# Patient Record
Sex: Male | Born: 1955
Health system: Southern US, Community
[De-identification: ages and names within clinical notes are randomized; demographics above are authoritative.]

## PROBLEM LIST (undated history)

## (undated) DIAGNOSIS — Z789 Other specified health status: Secondary | ICD-10-CM

## (undated) DIAGNOSIS — E7800 Pure hypercholesterolemia, unspecified: Secondary | ICD-10-CM

## (undated) DIAGNOSIS — N210 Calculus in bladder: Secondary | ICD-10-CM

## (undated) DIAGNOSIS — N390 Urinary tract infection, site not specified: Secondary | ICD-10-CM

## (undated) DIAGNOSIS — M109 Gout, unspecified: Secondary | ICD-10-CM

## (undated) DIAGNOSIS — I739 Peripheral vascular disease, unspecified: Secondary | ICD-10-CM

## (undated) DIAGNOSIS — K219 Gastro-esophageal reflux disease without esophagitis: Secondary | ICD-10-CM

## (undated) HISTORY — DX: Calculus in bladder: N21.0

## (undated) HISTORY — DX: Urinary tract infection, site not specified: N39.0

## (undated) HISTORY — PX: ROTATOR CUFF REPAIR: SHX139

## (undated) HISTORY — DX: Peripheral vascular disease, unspecified: I73.9

## (undated) HISTORY — PX: BLADDER SURGERY: SHX569

## (undated) HISTORY — PX: LACERATION REPAIR: SHX5168

---

## 2015-09-18 DIAGNOSIS — Z6828 Body mass index (BMI) 28.0-28.9, adult: Secondary | ICD-10-CM | POA: Diagnosis not present

## 2015-09-18 DIAGNOSIS — N390 Urinary tract infection, site not specified: Secondary | ICD-10-CM | POA: Diagnosis not present

## 2015-10-23 DIAGNOSIS — M1000 Idiopathic gout, unspecified site: Secondary | ICD-10-CM | POA: Diagnosis not present

## 2015-10-23 DIAGNOSIS — Z6827 Body mass index (BMI) 27.0-27.9, adult: Secondary | ICD-10-CM | POA: Diagnosis not present

## 2015-10-24 DIAGNOSIS — M10071 Idiopathic gout, right ankle and foot: Secondary | ICD-10-CM | POA: Diagnosis not present

## 2015-12-24 DIAGNOSIS — Z6828 Body mass index (BMI) 28.0-28.9, adult: Secondary | ICD-10-CM | POA: Diagnosis not present

## 2015-12-24 DIAGNOSIS — R300 Dysuria: Secondary | ICD-10-CM | POA: Diagnosis not present

## 2016-07-19 DIAGNOSIS — Z0001 Encounter for general adult medical examination with abnormal findings: Secondary | ICD-10-CM | POA: Diagnosis not present

## 2016-07-19 DIAGNOSIS — D751 Secondary polycythemia: Secondary | ICD-10-CM | POA: Diagnosis not present

## 2016-07-23 DIAGNOSIS — Z6826 Body mass index (BMI) 26.0-26.9, adult: Secondary | ICD-10-CM | POA: Diagnosis not present

## 2016-07-23 DIAGNOSIS — Z0001 Encounter for general adult medical examination with abnormal findings: Secondary | ICD-10-CM | POA: Diagnosis not present

## 2016-07-23 DIAGNOSIS — F17210 Nicotine dependence, cigarettes, uncomplicated: Secondary | ICD-10-CM | POA: Diagnosis not present

## 2017-01-13 DIAGNOSIS — F17210 Nicotine dependence, cigarettes, uncomplicated: Secondary | ICD-10-CM | POA: Diagnosis not present

## 2017-01-13 DIAGNOSIS — I10 Essential (primary) hypertension: Secondary | ICD-10-CM | POA: Diagnosis not present

## 2017-01-13 DIAGNOSIS — E782 Mixed hyperlipidemia: Secondary | ICD-10-CM | POA: Diagnosis not present

## 2017-01-13 DIAGNOSIS — Z9189 Other specified personal risk factors, not elsewhere classified: Secondary | ICD-10-CM | POA: Diagnosis not present

## 2017-01-19 DIAGNOSIS — Z0001 Encounter for general adult medical examination with abnormal findings: Secondary | ICD-10-CM | POA: Diagnosis not present

## 2017-01-19 DIAGNOSIS — Z6826 Body mass index (BMI) 26.0-26.9, adult: Secondary | ICD-10-CM | POA: Diagnosis not present

## 2017-01-19 DIAGNOSIS — Z1212 Encounter for screening for malignant neoplasm of rectum: Secondary | ICD-10-CM | POA: Diagnosis not present

## 2017-06-09 DIAGNOSIS — Z6826 Body mass index (BMI) 26.0-26.9, adult: Secondary | ICD-10-CM | POA: Diagnosis not present

## 2017-06-09 DIAGNOSIS — R1013 Epigastric pain: Secondary | ICD-10-CM | POA: Diagnosis not present

## 2017-06-09 DIAGNOSIS — R110 Nausea: Secondary | ICD-10-CM | POA: Diagnosis not present

## 2017-06-18 DIAGNOSIS — R1310 Dysphagia, unspecified: Secondary | ICD-10-CM | POA: Diagnosis not present

## 2017-06-18 DIAGNOSIS — Z6825 Body mass index (BMI) 25.0-25.9, adult: Secondary | ICD-10-CM | POA: Diagnosis not present

## 2017-06-18 DIAGNOSIS — J029 Acute pharyngitis, unspecified: Secondary | ICD-10-CM | POA: Diagnosis not present

## 2017-06-18 DIAGNOSIS — B9681 Helicobacter pylori [H. pylori] as the cause of diseases classified elsewhere: Secondary | ICD-10-CM | POA: Diagnosis not present

## 2017-06-22 DIAGNOSIS — Z6825 Body mass index (BMI) 25.0-25.9, adult: Secondary | ICD-10-CM | POA: Diagnosis not present

## 2017-06-22 DIAGNOSIS — B370 Candidal stomatitis: Secondary | ICD-10-CM | POA: Diagnosis not present

## 2017-07-14 DIAGNOSIS — I10 Essential (primary) hypertension: Secondary | ICD-10-CM | POA: Diagnosis not present

## 2017-07-14 DIAGNOSIS — M1000 Idiopathic gout, unspecified site: Secondary | ICD-10-CM | POA: Diagnosis not present

## 2017-07-14 DIAGNOSIS — D751 Secondary polycythemia: Secondary | ICD-10-CM | POA: Diagnosis not present

## 2017-07-14 DIAGNOSIS — F17210 Nicotine dependence, cigarettes, uncomplicated: Secondary | ICD-10-CM | POA: Diagnosis not present

## 2017-07-14 DIAGNOSIS — E782 Mixed hyperlipidemia: Secondary | ICD-10-CM | POA: Diagnosis not present

## 2017-07-20 DIAGNOSIS — F17210 Nicotine dependence, cigarettes, uncomplicated: Secondary | ICD-10-CM | POA: Diagnosis not present

## 2017-07-20 DIAGNOSIS — Z0001 Encounter for general adult medical examination with abnormal findings: Secondary | ICD-10-CM | POA: Diagnosis not present

## 2017-07-20 DIAGNOSIS — Z6825 Body mass index (BMI) 25.0-25.9, adult: Secondary | ICD-10-CM | POA: Diagnosis not present

## 2018-07-20 DIAGNOSIS — Z0001 Encounter for general adult medical examination with abnormal findings: Secondary | ICD-10-CM | POA: Diagnosis not present

## 2018-07-25 DIAGNOSIS — Z0001 Encounter for general adult medical examination with abnormal findings: Secondary | ICD-10-CM | POA: Diagnosis not present

## 2018-07-25 DIAGNOSIS — I10 Essential (primary) hypertension: Secondary | ICD-10-CM | POA: Diagnosis not present

## 2018-07-25 DIAGNOSIS — E782 Mixed hyperlipidemia: Secondary | ICD-10-CM | POA: Diagnosis not present

## 2018-07-25 DIAGNOSIS — R0602 Shortness of breath: Secondary | ICD-10-CM | POA: Diagnosis not present

## 2018-07-25 DIAGNOSIS — F5221 Male erectile disorder: Secondary | ICD-10-CM | POA: Diagnosis not present

## 2018-07-25 DIAGNOSIS — F17210 Nicotine dependence, cigarettes, uncomplicated: Secondary | ICD-10-CM | POA: Diagnosis not present

## 2018-07-25 DIAGNOSIS — I739 Peripheral vascular disease, unspecified: Secondary | ICD-10-CM | POA: Diagnosis not present

## 2018-07-25 DIAGNOSIS — Z1212 Encounter for screening for malignant neoplasm of rectum: Secondary | ICD-10-CM | POA: Diagnosis not present

## 2018-11-04 DIAGNOSIS — R140 Abdominal distension (gaseous): Secondary | ICD-10-CM | POA: Diagnosis not present

## 2018-11-04 DIAGNOSIS — K2970 Gastritis, unspecified, without bleeding: Secondary | ICD-10-CM | POA: Diagnosis not present

## 2018-11-04 DIAGNOSIS — D751 Secondary polycythemia: Secondary | ICD-10-CM | POA: Diagnosis not present

## 2018-11-04 DIAGNOSIS — K2900 Acute gastritis without bleeding: Secondary | ICD-10-CM | POA: Diagnosis not present

## 2018-11-04 DIAGNOSIS — Z6826 Body mass index (BMI) 26.0-26.9, adult: Secondary | ICD-10-CM | POA: Diagnosis not present

## 2018-11-04 DIAGNOSIS — R1013 Epigastric pain: Secondary | ICD-10-CM | POA: Diagnosis not present

## 2018-11-04 DIAGNOSIS — Z720 Tobacco use: Secondary | ICD-10-CM | POA: Diagnosis not present

## 2018-11-04 DIAGNOSIS — B9681 Helicobacter pylori [H. pylori] as the cause of diseases classified elsewhere: Secondary | ICD-10-CM | POA: Diagnosis not present

## 2018-11-04 DIAGNOSIS — F17200 Nicotine dependence, unspecified, uncomplicated: Secondary | ICD-10-CM | POA: Diagnosis not present

## 2018-11-04 DIAGNOSIS — R110 Nausea: Secondary | ICD-10-CM | POA: Diagnosis not present

## 2018-11-16 DIAGNOSIS — B370 Candidal stomatitis: Secondary | ICD-10-CM | POA: Diagnosis not present

## 2018-11-16 DIAGNOSIS — D751 Secondary polycythemia: Secondary | ICD-10-CM | POA: Diagnosis not present

## 2019-02-11 DIAGNOSIS — M109 Gout, unspecified: Secondary | ICD-10-CM | POA: Diagnosis not present

## 2019-02-11 DIAGNOSIS — K279 Peptic ulcer, site unspecified, unspecified as acute or chronic, without hemorrhage or perforation: Secondary | ICD-10-CM | POA: Diagnosis not present

## 2019-02-11 DIAGNOSIS — D751 Secondary polycythemia: Secondary | ICD-10-CM | POA: Diagnosis not present

## 2019-02-11 DIAGNOSIS — Z880 Allergy status to penicillin: Secondary | ICD-10-CM | POA: Diagnosis not present

## 2019-02-11 DIAGNOSIS — F17200 Nicotine dependence, unspecified, uncomplicated: Secondary | ICD-10-CM | POA: Diagnosis not present

## 2019-02-11 DIAGNOSIS — K2900 Acute gastritis without bleeding: Secondary | ICD-10-CM | POA: Diagnosis not present

## 2019-02-11 DIAGNOSIS — R109 Unspecified abdominal pain: Secondary | ICD-10-CM | POA: Diagnosis not present

## 2019-02-17 DIAGNOSIS — R109 Unspecified abdominal pain: Secondary | ICD-10-CM | POA: Diagnosis not present

## 2019-02-17 DIAGNOSIS — K146 Glossodynia: Secondary | ICD-10-CM | POA: Diagnosis not present

## 2019-02-17 DIAGNOSIS — F17210 Nicotine dependence, cigarettes, uncomplicated: Secondary | ICD-10-CM | POA: Diagnosis not present

## 2019-02-17 DIAGNOSIS — Z6826 Body mass index (BMI) 26.0-26.9, adult: Secondary | ICD-10-CM | POA: Diagnosis not present

## 2019-02-19 DIAGNOSIS — K279 Peptic ulcer, site unspecified, unspecified as acute or chronic, without hemorrhage or perforation: Secondary | ICD-10-CM | POA: Diagnosis not present

## 2019-03-06 DIAGNOSIS — E782 Mixed hyperlipidemia: Secondary | ICD-10-CM | POA: Diagnosis not present

## 2019-03-06 DIAGNOSIS — I739 Peripheral vascular disease, unspecified: Secondary | ICD-10-CM | POA: Diagnosis not present

## 2019-03-06 DIAGNOSIS — F17210 Nicotine dependence, cigarettes, uncomplicated: Secondary | ICD-10-CM | POA: Diagnosis not present

## 2019-03-06 DIAGNOSIS — I10 Essential (primary) hypertension: Secondary | ICD-10-CM | POA: Diagnosis not present

## 2019-03-06 DIAGNOSIS — Z23 Encounter for immunization: Secondary | ICD-10-CM | POA: Diagnosis not present

## 2019-03-19 ENCOUNTER — Ambulatory Visit (INDEPENDENT_AMBULATORY_CARE_PROVIDER_SITE_OTHER): Payer: BC Managed Care – PPO | Admitting: Otolaryngology

## 2019-03-19 DIAGNOSIS — K143 Hypertrophy of tongue papillae: Secondary | ICD-10-CM

## 2019-06-05 DIAGNOSIS — Z6827 Body mass index (BMI) 27.0-27.9, adult: Secondary | ICD-10-CM | POA: Diagnosis not present

## 2019-06-05 DIAGNOSIS — N390 Urinary tract infection, site not specified: Secondary | ICD-10-CM | POA: Diagnosis not present

## 2019-06-07 DIAGNOSIS — N390 Urinary tract infection, site not specified: Secondary | ICD-10-CM | POA: Diagnosis not present

## 2019-06-12 DIAGNOSIS — K6289 Other specified diseases of anus and rectum: Secondary | ICD-10-CM | POA: Diagnosis not present

## 2019-06-12 DIAGNOSIS — Z6826 Body mass index (BMI) 26.0-26.9, adult: Secondary | ICD-10-CM | POA: Diagnosis not present

## 2019-06-29 DIAGNOSIS — N390 Urinary tract infection, site not specified: Secondary | ICD-10-CM | POA: Diagnosis not present

## 2019-06-29 DIAGNOSIS — R300 Dysuria: Secondary | ICD-10-CM | POA: Diagnosis not present

## 2019-07-26 DIAGNOSIS — Z0001 Encounter for general adult medical examination with abnormal findings: Secondary | ICD-10-CM | POA: Diagnosis not present

## 2019-08-01 DIAGNOSIS — R0602 Shortness of breath: Secondary | ICD-10-CM | POA: Diagnosis not present

## 2019-08-01 DIAGNOSIS — E782 Mixed hyperlipidemia: Secondary | ICD-10-CM | POA: Diagnosis not present

## 2019-08-01 DIAGNOSIS — I10 Essential (primary) hypertension: Secondary | ICD-10-CM | POA: Diagnosis not present

## 2019-08-01 DIAGNOSIS — Z0001 Encounter for general adult medical examination with abnormal findings: Secondary | ICD-10-CM | POA: Diagnosis not present

## 2019-08-01 DIAGNOSIS — I739 Peripheral vascular disease, unspecified: Secondary | ICD-10-CM | POA: Diagnosis not present

## 2019-08-01 DIAGNOSIS — F17210 Nicotine dependence, cigarettes, uncomplicated: Secondary | ICD-10-CM | POA: Diagnosis not present

## 2019-08-01 DIAGNOSIS — Z1212 Encounter for screening for malignant neoplasm of rectum: Secondary | ICD-10-CM | POA: Diagnosis not present

## 2019-08-01 DIAGNOSIS — R300 Dysuria: Secondary | ICD-10-CM | POA: Diagnosis not present

## 2019-08-09 DIAGNOSIS — Z23 Encounter for immunization: Secondary | ICD-10-CM | POA: Diagnosis not present

## 2019-09-01 DIAGNOSIS — Z23 Encounter for immunization: Secondary | ICD-10-CM | POA: Diagnosis not present

## 2019-09-03 DIAGNOSIS — N3090 Cystitis, unspecified without hematuria: Secondary | ICD-10-CM | POA: Diagnosis not present

## 2019-09-03 DIAGNOSIS — Z6827 Body mass index (BMI) 27.0-27.9, adult: Secondary | ICD-10-CM | POA: Diagnosis not present

## 2019-10-08 DIAGNOSIS — N3090 Cystitis, unspecified without hematuria: Secondary | ICD-10-CM | POA: Diagnosis not present

## 2019-10-08 DIAGNOSIS — R300 Dysuria: Secondary | ICD-10-CM | POA: Diagnosis not present

## 2019-10-08 DIAGNOSIS — Z6826 Body mass index (BMI) 26.0-26.9, adult: Secondary | ICD-10-CM | POA: Diagnosis not present

## 2019-10-29 ENCOUNTER — Ambulatory Visit: Payer: BC Managed Care – PPO | Admitting: Urology

## 2019-11-14 ENCOUNTER — Ambulatory Visit (INDEPENDENT_AMBULATORY_CARE_PROVIDER_SITE_OTHER): Payer: BC Managed Care – PPO | Admitting: Urology

## 2019-11-14 ENCOUNTER — Encounter: Payer: Self-pay | Admitting: Urology

## 2019-11-14 ENCOUNTER — Other Ambulatory Visit (HOSPITAL_COMMUNITY)
Admission: RE | Admit: 2019-11-14 | Discharge: 2019-11-14 | Disposition: A | Discharge status: Home / Self Care | Payer: BC Managed Care – PPO | Source: Skilled Nursing Facility | Attending: Urology | Admitting: Urology

## 2019-11-14 ENCOUNTER — Other Ambulatory Visit: Payer: Self-pay

## 2019-11-14 VITALS — BP 184/93 | HR 80 | Temp 98.4°F | Ht 66.0 in | Wt 160.0 lb

## 2019-11-14 DIAGNOSIS — N390 Urinary tract infection, site not specified: Secondary | ICD-10-CM | POA: Insufficient documentation

## 2019-11-14 DIAGNOSIS — R300 Dysuria: Secondary | ICD-10-CM | POA: Diagnosis not present

## 2019-11-14 LAB — POCT URINALYSIS DIPSTICK
Bilirubin, UA: NEGATIVE
Glucose, UA: NEGATIVE
Ketones, UA: NEGATIVE
Nitrite, UA: NEGATIVE
Protein, UA: NEGATIVE
Spec Grav, UA: 1.02 (ref 1.010–1.025)
Urobilinogen, UA: 0.2 E.U./dL
pH, UA: 6 (ref 5.0–8.0)

## 2019-11-14 LAB — BLADDER SCAN AMB NON-IMAGING: Scan Result: 26.1

## 2019-11-14 NOTE — Progress Notes (Signed)
Urological Symptom Review  Patient is experiencing the following symptoms: Urinary tract infection Bladder stones Review of Systems   Gastrointestinal (upper)  : Negative for upper GI symptoms  Gastrointestinal (lower) : Negative for lower GI symptoms  Constitutional : Negative for symptoms  Skin: Negative for skin symptoms  Eyes: Negative for eye symptoms  Ear/Nose/Throat : Negative for Ear/Nose/Throat symptoms  Hematologic/Lymphatic: Negative for Hematologic/Lymphatic symptoms  Cardiovascular : Negative for cardiovascular symptoms  Respiratory : Negative for respiratory symptoms  Endocrine: Negative for endocrine symptoms  Musculoskeletal: None  Neurological: Negative for neurological symptoms  Psychologic: Negative for psychiatric symptoms

## 2019-11-14 NOTE — Progress Notes (Signed)
11/14/2019 4:09 PM   Ronnie Larsen [birth year] [id number]  Referring provider: Practice, [facility] [address] [place],  [place] [address]  Recurrent UTI and dysuria  HPI: [name] is a 64yo here for evaluation of recurrent UTI and dysuria. Over the past 10 years he has episodes of UTIs that occur in clusters. He was previously seen by Dr. Nechama Guard and was found to have multiple bladder calculi. He underwent cystolithalopaxy. He was doing well until the last 2-3 months when he was treated twice in a 3 week period for a UTI. Currently he has urinary urgency but no frequency, dysuria, nocturia, or gross hematuria.    PMH: Past Medical History:  Diagnosis Date  . Bladder stones   . Urinary tract infection     Surgical History: Past Surgical History:  Procedure Laterality Date  . BLADDER SURGERY      Home Medications:  Allergies as of 11/14/2019      Reactions   Penicillins       Medication List       Accurate as of November 14, 2019  4:09 PM. If you have any questions, ask your nurse or doctor.        omeprazole 20 MG capsule Commonly known as: PRILOSEC Take 20 mg by mouth daily.       Allergies:  Allergies  Allergen Reactions  . Penicillins     Family History: History reviewed. No pertinent family history.  Social History:  reports that he has been smoking. He has a 45.00 pack-year smoking history. He has never used smokeless tobacco. No history on file for alcohol use and drug use.  ROS: All other review of systems were reviewed and are negative except what is noted above in HPI  Physical Exam: BP (!) 184/93   Pulse 80   Temp 98.4 F (36.9 C)   Ht 5\' 6"  (1.676 m)   Wt 160 lb (72.6 kg)   BMI 25.82 kg/m   Constitutional:  Alert and oriented, No acute distress. HEENT: [place] AT, moist mucus membranes.  Trachea midline, no masses. Cardiovascular: No clubbing, cyanosis, or edema. Respiratory: Normal respiratory effort, no increased work of  breathing. GI: Abdomen is soft, nontender, nondistended, no abdominal masses GU: No CVA tenderness. Circumcised phallus. No masses/lesions on penis, testis, scrotum. Prostate 40g smooth no nodules no induration.  Lymph: No cervical or inguinal lymphadenopathy. Skin: No rashes, bruises or suspicious lesions. Neurologic: Grossly intact, no focal deficits, moving all 4 extremities. Psychiatric: Normal mood and affect.  Laboratory Data: No results found for: WBC, HGB, HCT, MCV, PLT  No results found for: CREATININE  No results found for: PSA  No results found for: TESTOSTERONE  No results found for: HGBA1C  Urinalysis    Component Value Date/Time   BILIRUBINUR neg 11/14/2019 1545   PROTEINUR Negative 11/14/2019 1545   UROBILINOGEN 0.2 11/14/2019 1545   NITRITE neg 11/14/2019 1545   LEUKOCYTESUR Trace (A) 11/14/2019 1545    No results found for: LABMICR, WBCUA, RBCUA, LABEPIT, MUCUS, BACTERIA  Pertinent Imaging:  No results found for this or any previous visit.  No results found for this or any previous visit.  No results found for this or any previous visit.  No results found for this or any previous visit.  No results found for this or any previous visit.  No results found for this or any previous visit.  No results found for this or any previous visit.  No results found for  this or any previous visit.   Assessment & Plan:    1. Recurrent UTI -CT stone study - POCT urinalysis dipstick - BLADDER SCAN AMB NON-IMAGING  2. Dysuria -likely related to his UTIs. We will await the CT stone study and the patient will need cystoscopy if the stone study is negative for calculi   No follow-ups on file.  Wilkie Aye, MD  [facility] [place]

## 2019-11-14 NOTE — Patient Instructions (Signed)
Bladder Stone  A bladder stone is a buildup of crystals made from the proteins and minerals found in urine. These substances build up when urine becomes too concentrated. Urine is concentrated when there is less water and more proteins and minerals in it. Bladder stones usually develop when a person has another medical condition that prevents the bladder from emptying completely. Crystals can form in the small amount of urine that is left in the bladder. Bladder stones that grow large can become painful and may block the flow of urine. What are the causes? This condition may be caused by:  An enlarged prostate, which prevents the bladder from emptying well.  An infection of a part of your urinary system (urinary tract infection, or UTI). This includes the: ? Kidneys. ? Bladder. ? Ureters. These are the tubes that carry urine to your bladder. ? Urethra. This is the tube that drains urine from your bladder.  A weak spot in the bladder that creates a small pouch (bladder diverticulum).  Nerve damage that may interfere with the signals from your brain to your bladder muscles (neurogenic bladder). This can result from conditions such as Parkinson's disease or spinal cord injuries. What increases the risk? This condition is more likely to develop in people who:  Get frequent UTIs.  Have another medical condition that affects the bladder.  Have a history of bladder surgery.  Have a spinal cord injury.  Have an abnormal shape of the bladder (deformity). What are the signs or symptoms? Common symptoms of this condition include:  Pain in the abdomen.  A need to urinate more often.  Difficulty or pain when urinating.  Blood in the urine.  Cloudy urine or urine that is dark in color.  Pain in the penis or testicles in men. Small bladder stones do not always cause symptoms. How is this diagnosed? This condition may be diagnosed based on your symptoms, medical history, and physical  exam. The physical exam will check for tenderness in your abdomen. For men, an exam in the rectum may be done to check the prostate gland.  You may have tests, such as: ? A urine test (urinalysis). ? A urine sample test to check for other infections (culture). ? Blood tests, including tests to look for a certain substance (creatinine). A creatinine level that is higher than normal could indicate a blockage. ? A procedure to check your bladder using a scope with a camera (cystoscopy).  You may also have imaging studies, such as: ? CT scan or ultrasound of your abdomen and the area between your hip bones (pelvis or pelvic area). ? An X-ray of your urinary system. How is this treated? This condition may be treated with:  Cystolitholapaxy. This procedure uses a laser, ultrasound, or other device to break the stone into smaller pieces. Fluids are used to flush the small pieces from the area.  Surgery to remove the stone.  A stent. This is a small mesh tube that is threaded into your ureter to make urine flow.  Medicines to treat pain. Follow these instructions at home: Medicines  Take over-the-counter and prescription medicines only as told by your health care provider.  Ask your health care provider if the medicine prescribed to you: ? Requires you to avoid driving or using heavy machinery. ? Can cause constipation. You may need to take these actions to prevent or treat constipation:  Take over-the-counter or prescription medicines.  Eat foods that are high in fiber, such as beans, whole   grains, and fresh fruits and vegetables.  Limit foods that are high in fat and processed sugars, such as fried or sweet foods. Alcohol use  Do not drink alcohol if: ? Your health care provider tells you not to drink. ? You are pregnant, may be pregnant, or are planning to become pregnant.  If you drink alcohol: ? Limit how much you drink to:  0-1 drink a day for women.  0-2 drinks a day for  men. ? Be aware of how much alcohol is in your drink. In the [place] one drink equals one 12 oz bottle of beer (355 mL), one 5 oz glass of wine (148 mL), or one 1 oz glass of hard liquor (44 mL). Activity  Rest as told by your health care provider.  Return to your normal activities as told by your health care provider. Ask your health care provider what activities are safe for you. General instructions   Drink enough fluid to keep your urine pale yellow.  Tell your health care provider about any unusual symptoms related to urinating. Early diagnosis of an enlarged prostate and other bladder conditions may reduce your risk of getting bladder stones.  Do not use any products that contain nicotine or tobacco, such as cigarettes, e-cigarettes, or chewing tobacco. If you need help quitting, ask your health care provider.  Do not use drugs. Where to find more information [facility] ([facility]): www.urologyhealth.org Contact a health care provider if you:  Have a fever.  Feel nauseous or vomit.  Are unable to urinate.  Have a large amount of blood in your urine. Get help right away if you:  Have severe back pain or pain in the lower part of your abdomen.  Cannot eat or drink without vomiting.  Vomit after taking your medicine. Summary  A bladder stone is a buildup of crystals made from the proteins and minerals found in urine. These substances build up when urine becomes too concentrated.  Bladder stones that grow large can become painful and may block the flow of urine.  Bladder stones may be treated with a laser, a stent, surgery, or pain medicines. This information is not intended to replace advice given to you by your health care provider. Make sure you discuss any questions you have with your health care provider. Document Revised: 11/23/2018 Document Reviewed: 11/23/2018 Elsevier Patient Education  2020 [employer]vier Inc.  

## 2019-11-16 LAB — URINE CULTURE: Culture: <10000 — AB

## 2019-11-22 ENCOUNTER — Telehealth: Payer: Self-pay

## 2019-11-22 NOTE — Telephone Encounter (Signed)
-----   Message from Malen Gauze, MD sent at 11/21/2019  1:34 PM EDT ----- negative ----- Message ----- From: Ferdinand Lango, RN Sent: 11/20/2019  10:13 AM EDT To: Malen Gauze, MD  culture

## 2019-11-22 NOTE — Telephone Encounter (Signed)
Called pt. No answer. No way to leave message . Letter mailed.

## 2019-12-06 ENCOUNTER — Ambulatory Visit (HOSPITAL_COMMUNITY)
Admission: RE | Admit: 2019-12-06 | Discharge: 2019-12-06 | Disposition: A | Discharge status: Home / Self Care | Payer: BC Managed Care – PPO | Source: Ambulatory Visit | Attending: Urology | Admitting: Urology

## 2019-12-06 ENCOUNTER — Other Ambulatory Visit: Payer: Self-pay

## 2019-12-06 DIAGNOSIS — I700 Atherosclerosis of aorta: Secondary | ICD-10-CM | POA: Diagnosis not present

## 2019-12-06 DIAGNOSIS — K760 Fatty (change of) liver, not elsewhere classified: Secondary | ICD-10-CM | POA: Diagnosis not present

## 2019-12-06 DIAGNOSIS — K828 Other specified diseases of gallbladder: Secondary | ICD-10-CM | POA: Diagnosis not present

## 2019-12-06 DIAGNOSIS — N390 Urinary tract infection, site not specified: Secondary | ICD-10-CM | POA: Insufficient documentation

## 2019-12-06 DIAGNOSIS — N4289 Other specified disorders of prostate: Secondary | ICD-10-CM | POA: Diagnosis not present

## 2019-12-12 ENCOUNTER — Ambulatory Visit (INDEPENDENT_AMBULATORY_CARE_PROVIDER_SITE_OTHER): Payer: BC Managed Care – PPO | Admitting: Urology

## 2019-12-12 ENCOUNTER — Other Ambulatory Visit: Payer: Self-pay

## 2019-12-12 ENCOUNTER — Encounter: Payer: Self-pay | Admitting: Urology

## 2019-12-12 VITALS — BP 170/84 | HR 85 | Temp 98.6°F | Ht 66.0 in | Wt 163.0 lb

## 2019-12-12 DIAGNOSIS — N3021 Other chronic cystitis with hematuria: Secondary | ICD-10-CM | POA: Insufficient documentation

## 2019-12-12 DIAGNOSIS — R300 Dysuria: Secondary | ICD-10-CM

## 2019-12-12 LAB — MICROSCOPIC EXAMINATION
Bacteria, UA: NONE SEEN
Epithelial Cells (non renal): >10 /hpf — AB (ref 0–10)

## 2019-12-12 LAB — URINALYSIS, ROUTINE W REFLEX MICROSCOPIC
Bilirubin, UA: NEGATIVE
Glucose, UA: NEGATIVE
Ketones, UA: NEGATIVE
Nitrite, UA: NEGATIVE
Protein,UA: NEGATIVE
Specific Gravity, UA: 1.025 (ref 1.005–1.030)
Urobilinogen, Ur: 0.2 mg/dL (ref 0.2–1.0)
pH, UA: 5 (ref 5.0–7.5)

## 2019-12-12 MED ORDER — ALFUZOSIN HCL ER 10 MG PO TB24: 10.0000 mg | ORAL_TABLET | Freq: Every day | ORAL | 11 refills | Status: AC

## 2019-12-12 NOTE — Progress Notes (Signed)
Urological Symptom Review  Patient is experiencing the following symptoms: Get up at night to urinate  Once per night Review of Systems  Gastrointestinal (upper)  : Negative for upper GI symptoms  Gastrointestinal (lower) : Negative for lower GI symptoms  Constitutional : Negative for symptoms  Skin: Negative for skin symptoms  Eyes: Negative for eye symptoms  Ear/Nose/Throat : Negative for Ear/Nose/Throat symptoms  Hematologic/Lymphatic: Negative for Hematologic/Lymphatic symptoms  Cardiovascular : Negative for cardiovascular symptoms  Respiratory : Negative for respiratory symptoms  Endocrine: Negative for endocrine symptoms  Musculoskeletal: Negative for musculoskeletal symptoms  Neurological: Negative for neurological symptoms  Psychologic: Negative for psychiatric symptoms

## 2019-12-12 NOTE — Patient Instructions (Signed)

## 2019-12-12 NOTE — Progress Notes (Signed)
12/12/2019 3:23 PM   Gilberto Better. 04/27/1956 [id number]  Referring provider: No referring provider defined for this encounter.  Recurrent UTI  HPI: Mr Weldon is a 64yo here for followuo for recurrent UTI. No UTIs in 2-3 months. No dysuria currently. Nocturia 1-3x. Fair stream. No urgency or frequency. No hematuria.   PMH: Past Medical History:  Diagnosis Date  . Bladder stones   . Urinary tract infection     Surgical History: Past Surgical History:  Procedure Laterality Date  . BLADDER SURGERY      Home Medications:  Allergies as of 12/12/2019      Reactions   Penicillins       Medication List       Accurate as of December 12, 2019  3:23 PM. If you have any questions, ask your nurse or doctor.        omeprazole 20 MG capsule Commonly known as: PRILOSEC Take 20 mg by mouth daily.       Allergies:  Allergies  Allergen Reactions  . Penicillins     Family History: No family history on file.  Social History:  reports that he has been smoking. He has a 45.00 pack-year smoking history. He has never used smokeless tobacco. No history on file for alcohol use and drug use.  ROS: All other review of systems were reviewed and are negative except what is noted above in HPI  Physical Exam: BP (!) 170/84   Pulse 85   Temp 98.6 F (37 C)   Ht 5\' 6"  (1.676 m)   Wt 163 lb (73.9 kg)   BMI 26.31 kg/m   Constitutional:  Alert and oriented, No acute distress. HEENT: [place] AT, moist mucus membranes.  Trachea midline, no masses. Cardiovascular: No clubbing, cyanosis, or edema. Respiratory: Normal respiratory effort, no increased work of breathing. GI: Abdomen is soft, nontender, nondistended, no abdominal masses GU: No CVA tenderness.  Lymph: No cervical or inguinal lymphadenopathy. Skin: No rashes, bruises or suspicious lesions. Neurologic: Grossly intact, no focal deficits, moving all 4 extremities. Psychiatric: Normal mood and affect.  Laboratory  Data: No results found for: WBC, HGB, HCT, MCV, PLT  No results found for: CREATININE  No results found for: PSA  No results found for: TESTOSTERONE  No results found for: HGBA1C  Urinalysis    Component Value Date/Time   APPEARANCEUR Clear 12/12/2019 1510   GLUCOSEU Negative 12/12/2019 1510   BILIRUBINUR Negative 12/12/2019 1510   PROTEINUR Negative 12/12/2019 1510   UROBILINOGEN 0.2 11/14/2019 1545   NITRITE Negative 12/12/2019 1510   LEUKOCYTESUR 1+ (A) 12/12/2019 1510    Lab Results  Component Value Date   LABMICR See below: 12/12/2019   WBCUA 6-10 (A) 12/12/2019   LABEPIT >10 (A) 12/12/2019   BACTERIA None seen 12/12/2019    Pertinent Imaging: CT stone study 12/07/2019: Images reviewed and discussed wit the patient No results found for this or any previous visit.  No results found for this or any previous visit.  No results found for this or any previous visit.  No results found for this or any previous visit.  No results found for this or any previous visit.  No results found for this or any previous visit.  No results found for this or any previous visit.  Results for orders placed during the hospital encounter of 12/06/19  CT RENAL STONE STUDY  Narrative CLINICAL DATA:  Urinary urgency. History of urinary tract infections. Renal calculi.  EXAM: CT ABDOMEN AND  PELVIS WITHOUT CONTRAST  TECHNIQUE: Multidetector CT imaging of the abdomen and pelvis was performed following the standard protocol without IV contrast.  COMPARISON:  Radiograph 11/04/2018  FINDINGS: Lower chest: Airway thickening is present, suggesting bronchitis or reactive airways disease. Mild airway plugging in the right lower lobe. Right coronary artery atherosclerotic calcification and descending thoracic aortic atherosclerosis.  Hepatobiliary: Diffuse although somewhat patchy hepatic steatosis. Morphology and lobularity of the liver raise suspicion for early cirrhosis.  Contracted gallbladder.  Pancreas: Unremarkable  Spleen: No focal splenic lesion on noncontrast imaging. The spleen measures 11.0 by 10.3 by 5.3 cm (volume = 310 cm^3).  Adrenals/Urinary Tract: Punctate calcification in the right adrenal gland likely due to remote insult. No adrenal mass. 8 mm exophytic fluid density lesion of the left mid kidney laterally on image 28/2, favoring a cyst.  No urinary tract calculi are identified. No hydronephrosis or hydroureter.  Stomach/Bowel: Unremarkable  Vascular/Lymphatic: Aortoiliac atherosclerotic vascular disease. No pathologic adenopathy.  Reproductive: Dystrophic calcifications centrally in the prostate gland.  Other: Small focus of chronic fat necrosis in the left pelvis on image 39/5.  Musculoskeletal: Unremarkable  IMPRESSION: 1. A specific cause for the patient's hematuria is not identified. 2. Airway thickening is present, suggesting bronchitis or reactive airways disease. Mild airway plugging in the right lower lobe. 3. Diffuse although somewhat patchy hepatic steatosis. Morphology and lobularity of the liver raise suspicion for early cirrhosis. 4. Borderline splenomegaly. 5. Coronary atherosclerosis. 6. Small focus of chronic fat necrosis in the left pelvis. 7. Aortic atherosclerosis.  Aortic Atherosclerosis (ICD10-I70.0).   Electronically Signed By: Gaylyn Rong M.D. On: 12/07/2019 09:48   Assessment & Plan:    1. Dysuria -resolved  - Urinalysis, Routine w reflex microscopic  2. Chronic cystitis with hematuria, BPH -We will start uroxatral 10mg  dailg. RTC 3 months    No follow-ups on file.  , MD  [facility] [place]

## 2019-12-13 DIAGNOSIS — F17210 Nicotine dependence, cigarettes, uncomplicated: Secondary | ICD-10-CM | POA: Diagnosis not present

## 2019-12-13 DIAGNOSIS — K760 Fatty (change of) liver, not elsewhere classified: Secondary | ICD-10-CM | POA: Diagnosis not present

## 2019-12-13 DIAGNOSIS — Z87891 Personal history of nicotine dependence: Secondary | ICD-10-CM | POA: Diagnosis not present

## 2019-12-13 DIAGNOSIS — J439 Emphysema, unspecified: Secondary | ICD-10-CM | POA: Diagnosis not present

## 2019-12-13 DIAGNOSIS — I700 Atherosclerosis of aorta: Secondary | ICD-10-CM | POA: Diagnosis not present

## 2019-12-25 NOTE — Progress Notes (Signed)
Letter sent.

## 2020-01-24 DIAGNOSIS — K219 Gastro-esophageal reflux disease without esophagitis: Secondary | ICD-10-CM | POA: Diagnosis not present

## 2020-01-24 DIAGNOSIS — B9681 Helicobacter pylori [H. pylori] as the cause of diseases classified elsewhere: Secondary | ICD-10-CM | POA: Diagnosis not present

## 2020-01-24 DIAGNOSIS — I10 Essential (primary) hypertension: Secondary | ICD-10-CM | POA: Diagnosis not present

## 2020-01-24 DIAGNOSIS — E782 Mixed hyperlipidemia: Secondary | ICD-10-CM | POA: Diagnosis not present

## 2020-01-31 DIAGNOSIS — F5221 Male erectile disorder: Secondary | ICD-10-CM | POA: Diagnosis not present

## 2020-01-31 DIAGNOSIS — F17210 Nicotine dependence, cigarettes, uncomplicated: Secondary | ICD-10-CM | POA: Diagnosis not present

## 2020-01-31 DIAGNOSIS — I10 Essential (primary) hypertension: Secondary | ICD-10-CM | POA: Diagnosis not present

## 2020-01-31 DIAGNOSIS — R0602 Shortness of breath: Secondary | ICD-10-CM | POA: Diagnosis not present

## 2020-01-31 DIAGNOSIS — E782 Mixed hyperlipidemia: Secondary | ICD-10-CM | POA: Diagnosis not present

## 2020-01-31 DIAGNOSIS — Z23 Encounter for immunization: Secondary | ICD-10-CM | POA: Diagnosis not present

## 2020-03-11 DIAGNOSIS — I739 Peripheral vascular disease, unspecified: Secondary | ICD-10-CM | POA: Diagnosis not present

## 2020-03-11 DIAGNOSIS — Z6828 Body mass index (BMI) 28.0-28.9, adult: Secondary | ICD-10-CM | POA: Diagnosis not present

## 2020-03-19 ENCOUNTER — Ambulatory Visit: Payer: BC Managed Care – PPO | Admitting: Urology

## 2020-04-03 ENCOUNTER — Other Ambulatory Visit: Payer: Self-pay | Admitting: Family Medicine

## 2020-04-03 ENCOUNTER — Other Ambulatory Visit: Payer: Self-pay | Admitting: Pediatrics

## 2020-04-03 DIAGNOSIS — I739 Peripheral vascular disease, unspecified: Secondary | ICD-10-CM

## 2020-04-09 ENCOUNTER — Ambulatory Visit
Admission: RE | Admit: 2020-04-09 | Discharge: 2020-04-09 | Disposition: A | Discharge status: Home / Self Care | Payer: BC Managed Care – PPO | Source: Ambulatory Visit | Attending: Family Medicine | Admitting: Family Medicine

## 2020-04-09 ENCOUNTER — Other Ambulatory Visit: Payer: Self-pay | Admitting: Interventional Radiology

## 2020-04-09 DIAGNOSIS — I739 Peripheral vascular disease, unspecified: Secondary | ICD-10-CM

## 2020-04-09 HISTORY — PX: IR RADIOLOGIST EVAL & MGMT: IMG5224

## 2020-04-09 NOTE — H&P (Signed)
Chief Complaint: Bilateral lower extremity claudication  Referring Physician(s): Howard,Kevin  Patient Status: [facility] - Out-pt  History of Present Illness: Ronnie Larsen. is a 64 y.o. male with pertinent past medical history significant for hyperlipidemia and 1-1.5 pack per day smoking history for ~45 years.  He presents to IR clinic after recently having ABIs performed which were 0.46 on the right, 0.64 on the left.    He endorses cramping, burning pain in his feet, calves, and thighs when walking.  He can walk approximately 25-50 yards before experiencing this pain.  Some days the pain is worse than others.  He denies any ulceration or poorly healing wounds on his lower extremities. He denies significant pain at rest.  He does endorse chronic coldness of his feet.  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  [place] time working outside.  He is afraid of losing his job due to his inability to ambulate as well as he needs to.    He reports taking cilostazol, prescribed by his primary care physician, which has not helped improve his claudication.  He took 2.5 mg Xarelto daily as a sample medication, but is no longer taking it.  He takes Crestor 5 mg daily.  He has otherwise been feeling well lately with no fevers, chills, unexplained weight gain, chest pain, shortness of breath, nausea, vomiting, abdominal pain, skin changes.  Past Medical History:  Diagnosis Date  . Bladder stones   . Urinary tract infection     Past Surgical History:  Procedure Laterality Date  . BLADDER SURGERY      Allergies: Penicillins  Medications: Prior to Admission medications   Medication Sig Start Date End Date Taking? Authorizing Provider  alfuzosin (UROXATRAL) 10 MG 24 hr tablet Take 1 tablet (10 mg total) by mouth daily with breakfast. 12/12/19   McKenzie, Mardene Celeste, MD  omeprazole  (PRILOSEC) 20 MG capsule Take 20 mg by mouth daily.    [provider]     No family history on file.  Social History   Socioeconomic History  . Marital status: Unknown    Spouse name: Not on file  . Number of children: 3  . Years of education: Not on file  . Highest education level: Not on file  Occupational History  . Not on file  Tobacco Use  . Smoking status: Current Every Day Smoker    Packs/day: 1.00    Years: 45.00    Pack years: 45.00  . Smokeless tobacco: Never Used  Vaping Use  . Vaping Use: Never used  Substance and Sexual Activity  . Alcohol use: Not on file  . Drug use: Not on file  . Sexual activity: Not on file  Other Topics Concern  . Not on file  Social History Narrative  . Not on file   Social Determinants of Health   Financial Resource Strain:   . Difficulty of Paying Living Expenses: Not on file  Food Insecurity:   . Worried About Programme researcher, broadcasting/film/video in the Last Year: Not on file  . Ran Out of Food in the Last Year: Not on file  Transportation Needs:   . Lack of Transportation (Medical): Not on file  . Lack of Transportation (Non-Medical): Not on file  Physical Activity:   . Days of Exercise per Week: Not on file  .  Minutes of Exercise per Session: Not on file  Stress:   . Feeling of Stress : Not on file  Social Connections:   . Frequency of Communication with Friends and Family: Not on file  . Frequency of Social Gatherings with Friends and Family: Not on file  . Attends Religious Services: Not on file  . Active Member of Clubs or Organizations: Not on file  . Attends Banker Meetings: Not on file  . Marital Status: Not on file     Review of Systems: A 12 point ROS discussed and pertinent positives are indicated in the HPI above.  All other systems are negative.  Vital Signs: There were no vitals taken for this visit.  Physical Exam Constitutional:      General: He is not in acute distress. HENT:     Head:  Normocephalic.     Mouth/Throat:     Mouth: Mucous membranes are moist.  Cardiovascular:     Rate and Rhythm: Normal rate and regular rhythm.     Comments: Palpable left DP.  Non-palpable left PT.  Non-palpable right DP and PT. Pulmonary:     Effort: Pulmonary effort is normal.  Abdominal:     General: There is no distension.  Musculoskeletal:     Right lower leg: No edema.     Left lower leg: No edema.  Skin:    General: Skin is warm and dry.  Neurological:     Mental Status: He is alert and oriented to person, place, and time.  Psychiatric:        Mood and Affect: Mood normal.        Behavior: Behavior normal.     Imaging: ABI 04/02/20 IMPRESSION: 1. Severe right lower extremity peripheral artery disease with suggestion of most severe stenosis in the superficial femoral artery. Right ABI = 0.46. 2. Moderate left lower extremity peripheral artery disease with suggestion of most severe stenosis in the proximal left superficial femoral artery. Left ABI = 0.65.   Labs:  CBC: No results for input(s): WBC, HGB, HCT, PLT in the last 8760 hours.  COAGS: No results for input(s): INR, APTT in the last 8760 hours.  BMP: No results for input(s): NA, K, CL, CO2, GLUCOSE, BUN, CALCIUM, CREATININE, GFRNONAA, GFRAA in the last 8760 hours.  Invalid input(s): CMP  LIVER FUNCTION TESTS: No results for input(s): BILITOT, AST, ALT, ALKPHOS, PROT, ALBUMIN in the last 8760 hours.  TUMOR MARKERS: No results for input(s): AFPTM, CEA, CA199, CHROMGRNA in the last 8760 hours.  Assessment and Plan: 64 year old male with bilateral (right greater than left) lower extremity claudication and non-invasive imaging compatible with severe right and moderate left lower extremity peripheral artery disease, suggestive of flow-limiting stenoses at the level of the superficial femoral artery - Rutherford class 3. Currently his most significant modifiable risk factor is smoking.  We briefly discussed  smoking cessation, and he states he may try to stop once he finishes the cigarettes he currently has.  He has tried other methods to no avail.  Additionally, with his degree of atherosclerosis, transitioning from his current dose of 5 mg Crestor to high-dose statin therapy at 40 mg daily would be beneficial, however he has had severe myalgias with statins in the past.  He would also benefit from daily 81 mg aspirin.  I will reach out to his PCP, Dr. Garner Nash on this matter.  Plan for CTA abdomen/pelvis with bilateral lower extremity runoff for further characterization and pre-procedural planning.  I will review images once completed to formulate exact plan for possible intervention.  We briefly discussed possibilities for intervention and the general concepts of endovascular treatment of peripheral artery disease.   Thank you for this interesting consult.  I greatly enjoyed meeting Ronnie Larsen. and look forward to participating in their care.  A copy of this report was sent to the requesting provider on this date.  Electronically Signed: Bennie Dallas, MD 04/09/2020, 1:32 PM   I spent a total of  40 Minutes in face to face in clinical consultation, greater than 50% of which was counseling/coordinating care for claudication/peripheral artery disease.

## 2020-05-02 DIAGNOSIS — N390 Urinary tract infection, site not specified: Secondary | ICD-10-CM | POA: Diagnosis not present

## 2020-05-02 DIAGNOSIS — Z6828 Body mass index (BMI) 28.0-28.9, adult: Secondary | ICD-10-CM | POA: Diagnosis not present

## 2020-05-02 DIAGNOSIS — R300 Dysuria: Secondary | ICD-10-CM | POA: Diagnosis not present

## 2020-05-06 DIAGNOSIS — Z23 Encounter for immunization: Secondary | ICD-10-CM | POA: Diagnosis not present

## 2020-05-08 ENCOUNTER — Other Ambulatory Visit: Payer: Self-pay

## 2020-05-08 ENCOUNTER — Telehealth: Payer: Self-pay | Admitting: Interventional Radiology

## 2020-05-08 ENCOUNTER — Ambulatory Visit (HOSPITAL_COMMUNITY)
Admission: RE | Admit: 2020-05-08 | Discharge: 2020-05-08 | Disposition: A | Discharge status: Home / Self Care | Payer: BC Managed Care – PPO | Source: Ambulatory Visit | Attending: Interventional Radiology | Admitting: Interventional Radiology

## 2020-05-08 DIAGNOSIS — I739 Peripheral vascular disease, unspecified: Secondary | ICD-10-CM | POA: Diagnosis not present

## 2020-05-08 LAB — POCT I-STAT CREATININE: Creatinine, Ser: 1.3 mg/dL — ABNORMAL HIGH (ref 0.61–1.24)

## 2020-05-08 MED ORDER — IOHEXOL 350 MG/ML SOLN
100.0000 mL | Freq: Once | INTRAVENOUS | Status: AC | PRN
  Administered 2020-05-08: 100 mL via INTRAVENOUS

## 2020-05-08 NOTE — Telephone Encounter (Signed)
I spoke with Mr. Narang today via telephone regarding his recent CT abdomen/pelvis with runoff.  We discussed the degree of aortobiliac atherosclerotic disease and chronic total occlusions of both SFAs.  We discussed how endovascular approach could technically be pursued, but how consideration of bypass grafts would be in his best long term interest due to higher patency rate, particularly given his the length of total occlusion and diminutive iliac vessels in addition to his desired physical activity level that is currently hindered by severe claudication.   He is agreeable to be seen by Vascular Surgery, will place referral.  Marliss Coots, MD Pager: [phone]

## 2020-06-09 ENCOUNTER — Encounter: Payer: Self-pay | Admitting: Vascular Surgery

## 2020-06-09 ENCOUNTER — Ambulatory Visit: Payer: BC Managed Care – PPO | Admitting: Vascular Surgery

## 2020-06-09 ENCOUNTER — Other Ambulatory Visit: Payer: Self-pay

## 2020-06-09 VITALS — BP 172/100 | HR 79 | Temp 99.3°F | Resp 16 | Ht 66.0 in | Wt 178.0 lb

## 2020-06-09 DIAGNOSIS — I70213 Atherosclerosis of native arteries of extremities with intermittent claudication, bilateral legs: Secondary | ICD-10-CM | POA: Diagnosis not present

## 2020-06-09 NOTE — Progress Notes (Signed)
  [facility]ist of [place]  Patient name: Ronnie D Maranto Jr. MRN: [id number] DOB: 03/15/1956 Sex: male  REASON FOR CONSULT: Evaluation multilevel arterial insufficiency with severe intermittent claudication  HPI: Ronnie D Gebert Jr. is a 64 y.o. male, who is here today for evaluation.  He reports severe intermittent claudication.  Cannot walk around his home without limitation.  Reports inability to walk uphill.  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  Also has to do some moving of pallets.  He underwent noninvasive studies revealing moderate arterial insufficiency and then underwent CT scan showing severe multilevel disease.  He is seen today for discussion of treatment options.  He has no arterial rest pain.  He has no lower extremity tissue loss.  He reports that the right and left leg are equal.  Involves his whole legs from his hips down into his feet with walking.  This is relieved by rest but then recurs very quickly with walking.  He has tried Pletal and see no benefit.  He has a long history of cigarette smoking.  He denies any cardiac disease.  Past Medical History:  Diagnosis Date  . Bladder stones   . Urinary tract infection     History reviewed. No pertinent family history.  SOCIAL HISTORY: Social History   Socioeconomic History  . Marital status: Single    Spouse name: Not on file  . Number of children: 3  . Years of education: Not on file  . Highest education level: Not on file  Occupational History  . Not on file  Tobacco Use  . Smoking status: Current Every Day Smoker    Packs/day: 1.00    Years: 45.00    Pack years: 45.00  . Smokeless tobacco: Never Used  Vaping Use  . Vaping Use: Never used  Substance and Sexual Activity  . Alcohol use: Not on file  . Drug use: Not on file  . Sexual activity: Not on file  Other Topics Concern  . Not on file  Social History Narrative   . Not on file   Social Determinants of Health   Financial Resource Strain: Not on file  Food Insecurity: Not on file  Transportation Needs: Not on file  Physical Activity: Not on file  Stress: Not on file  Social Connections: Not on file  Intimate Partner Violence: Not on file    Allergies  Allergen Reactions  . Penicillins     Current Outpatient Medications  Medication Sig Dispense Refill  . alfuzosin (UROXATRAL) 10 MG 24 hr tablet Take 1 tablet (10 mg total) by mouth daily with breakfast. 30 tablet 11  . cilostazol (PLETAL) 50 MG tablet Take 50 mg by mouth 2 (two) times daily.    . omeprazole (PRILOSEC) 20 MG capsule Take 20 mg by mouth daily.    . rivaroxaban (XARELTO) 2.5 MG TABS tablet Take 2.5 mg by mouth 2 (two) times daily.    . rosuvastatin (CRESTOR) 5 MG tablet Take 5 mg by mouth daily.     No current facility-administered medications for this visit.    REVIEW OF SYSTEMS:  [X] denotes positive finding, [ ] denotes negative finding Cardiac  Comments:  Chest pain or chest pressure:    Shortness of breath upon exertion:    Short of breath when lying flat:    Irregular heart rhythm:        Vascular    Pain in calf, thigh, or   hip brought on by ambulation: x   Pain in feet at night that wakes you up from your sleep:     Blood clot in your veins:    Leg swelling:         Pulmonary    Oxygen at home:    Productive cough:     Wheezing:         Neurologic    Sudden weakness in arms or legs:     Sudden numbness in arms or legs:     Sudden onset of difficulty speaking or slurred speech:    Temporary loss of vision in one eye:     Problems with dizziness:         Gastrointestinal    Blood in stool:     Vomited blood:         Genitourinary    Burning when urinating:     Blood in urine:        Psychiatric    Major depression:         Hematologic    Bleeding problems:    Problems with blood clotting too easily:        Skin    Rashes or ulcers:         Constitutional    Fever or chills:      PHYSICAL EXAM: Vitals:   06/09/20 0957  BP: (!) 172/100  Pulse: 79  Resp: 16  Temp: 99.3 F (37.4 C)  TempSrc: Other (Comment)  SpO2: 96%  Weight: 178 lb (80.7 kg)  Height: 5\' 6"  (1.676 m)    GENERAL: The patient is a well-nourished male, in no acute distress. The vital signs are documented above. CARDIOVASCULAR: Carotid arteries without bruits bilaterally.  2+ radial pulses bilaterally.  I do not palpate femoral pulses bilaterally.  He has no palpable pedal pulses PULMONARY: There is good air exchange  ABDOMEN: Soft and non-tender  MUSCULOSKELETAL: There are no major deformities or cyanosis. NEUROLOGIC: No focal weakness or paresthesias are detected. SKIN: There are no ulcers or rashes noted. PSYCHIATRIC: The patient has a normal affect.  DATA:  Noninvasive studies reveal ankle arm index 1.46 on the right and 0.64 on the left  CT angio aorta and bifemoral from 05/08/2020 reviewed with the patient.  This reveals moderate 50% narrowing in the infrarenal aorta.  There are severe long segment narrowing of very small external iliac arteries bilaterally.  He has total occlusions of superficial femoral arteries in the mid thigh with reconstitution of the below-knee popliteal and three-vessel runoff bilaterally  MEDICAL ISSUES: Severe limiting claudication.  No evidence of critical limb ischemia.  I discussed options with the patient.  I certainly agree with Dr. 05/10/2020 with interventional radiology that endovascular options would certainly be limited.  He has no cardiac history and no other major health issues.  I have recommended aortobifemoral bypass for revascularization.  I did explain that this would not address his superficial femoral artery occlusive disease but with normal antegrade flow into his profunda bilaterally, I feel that this will resolve his limiting claudication.  I discussed the significant magnitude of the surgery with  expected 1 to 2-day intensive care stay and a expected 5 to 6-day hospitalization.  Explained that he would be quite weak on discharge and would be several 2 to 3 months to return to his normal stamina.  Also explained lifting restrictions for the first 3 months.  I do not feel that he currently has limb threatening ischemia and therefore  did discuss the option of observation only.  He is quite miserable and unable to do his job or enjoy any activity.  Will proceed with preoperative cardiac clearance in  and then plan aortobifemoral bypass at his convenience   Larina Earthly, MD [facility] Tel [phone] Pager [phone]

## 2020-06-09 NOTE — H&P (View-Only) (Signed)
Vascular and Vein Specialist of [place]  Patient name: Ronnie Larsen. MRN: [id number] DOB: 01-13-1956 Sex: male  REASON FOR CONSULT: Evaluation multilevel arterial insufficiency with severe intermittent claudication  HPI: Ronnie Larsen. is a 66 y.o. male, who is here today for evaluation.  He reports severe intermittent claudication.  Cannot walk around his home without limitation.  Reports inability to walk uphill.  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  Also has to do some moving of pallets.  He underwent noninvasive studies revealing moderate arterial insufficiency and then underwent CT scan showing severe multilevel disease.  He is seen today for discussion of treatment options.  He has no arterial rest pain.  He has no lower extremity tissue loss.  He reports that the right and left leg are equal.  Involves his whole legs from his hips down into his feet with walking.  This is relieved by rest but then recurs very quickly with walking.  He has tried Pletal and see no benefit.  He has a long history of cigarette smoking.  He denies any cardiac disease.  Past Medical History:  Diagnosis Date  . Bladder stones   . Urinary tract infection     History reviewed. No pertinent family history.  SOCIAL HISTORY: Social History   Socioeconomic History  . Marital status: Single    Spouse name: Not on file  . Number of children: 3  . Years of education: Not on file  . Highest education level: Not on file  Occupational History  . Not on file  Tobacco Use  . Smoking status: Current Every Day Smoker    Packs/day: 1.00    Years: 45.00    Pack years: 45.00  . Smokeless tobacco: Never Used  Vaping Use  . Vaping Use: Never used  Substance and Sexual Activity  . Alcohol use: Not on file  . Drug use: Not on file  . Sexual activity: Not on file  Other Topics Concern  . Not on file  Social History Narrative   . Not on file   Social Determinants of Health   Financial Resource Strain: Not on file  Food Insecurity: Not on file  Transportation Needs: Not on file  Physical Activity: Not on file  Stress: Not on file  Social Connections: Not on file  Intimate Partner Violence: Not on file    Allergies  Allergen Reactions  . Penicillins     Current Outpatient Medications  Medication Sig Dispense Refill  . alfuzosin (UROXATRAL) 10 MG 24 hr tablet Take 1 tablet (10 mg total) by mouth daily with breakfast. 30 tablet 11  . cilostazol (PLETAL) 50 MG tablet Take 50 mg by mouth 2 (two) times daily.    Marland Kitchen omeprazole (PRILOSEC) 20 MG capsule Take 20 mg by mouth daily.    . rivaroxaban (XARELTO) 2.5 MG TABS tablet Take 2.5 mg by mouth 2 (two) times daily.    . rosuvastatin (CRESTOR) 5 MG tablet Take 5 mg by mouth daily.     No current facility-administered medications for this visit.    REVIEW OF SYSTEMS:  [X]  denotes positive finding, [ ]  denotes negative finding Cardiac  Comments:  Chest pain or chest pressure:    Shortness of breath upon exertion:    Short of breath when lying flat:    Irregular heart rhythm:        Vascular    Pain in calf, thigh, or  hip brought on by ambulation: x   Pain in feet at night that wakes you up from your sleep:     Blood clot in your veins:    Leg swelling:         Pulmonary    Oxygen at home:    Productive cough:     Wheezing:         Neurologic    Sudden weakness in arms or legs:     Sudden numbness in arms or legs:     Sudden onset of difficulty speaking or slurred speech:    Temporary loss of vision in one eye:     Problems with dizziness:         Gastrointestinal    Blood in stool:     Vomited blood:         Genitourinary    Burning when urinating:     Blood in urine:        Psychiatric    Major depression:         Hematologic    Bleeding problems:    Problems with blood clotting too easily:        Skin    Rashes or ulcers:         Constitutional    Fever or chills:      PHYSICAL EXAM: Vitals:   06/09/20 0957  BP: (!) 172/100  Pulse: 79  Resp: 16  Temp: 99.3 F (37.4 C)  TempSrc: Other (Comment)  SpO2: 96%  Weight: 178 lb (80.7 kg)  Height: 5\' 6"  (1.676 m)    GENERAL: The patient is a well-nourished male, in no acute distress. The vital signs are documented above. CARDIOVASCULAR: Carotid arteries without bruits bilaterally.  2+ radial pulses bilaterally.  I do not palpate femoral pulses bilaterally.  He has no palpable pedal pulses PULMONARY: There is good air exchange  ABDOMEN: Soft and non-tender  MUSCULOSKELETAL: There are no major deformities or cyanosis. NEUROLOGIC: No focal weakness or paresthesias are detected. SKIN: There are no ulcers or rashes noted. PSYCHIATRIC: The patient has a normal affect.  DATA:  Noninvasive studies reveal ankle arm index 1.46 on the right and 0.64 on the left  CT angio aorta and bifemoral from 05/08/2020 reviewed with the patient.  This reveals moderate 50% narrowing in the infrarenal aorta.  There are severe long segment narrowing of very small external iliac arteries bilaterally.  He has total occlusions of superficial femoral arteries in the mid thigh with reconstitution of the below-knee popliteal and three-vessel runoff bilaterally  MEDICAL ISSUES: Severe limiting claudication.  No evidence of critical limb ischemia.  I discussed options with the patient.  I certainly agree with Dr. 05/10/2020 with interventional radiology that endovascular options would certainly be limited.  He has no cardiac history and no other major health issues.  I have recommended aortobifemoral bypass for revascularization.  I did explain that this would not address his superficial femoral artery occlusive disease but with normal antegrade flow into his profunda bilaterally, I feel that this will resolve his limiting claudication.  I discussed the significant magnitude of the surgery with  expected 1 to 2-day intensive care stay and a expected 5 to 6-day hospitalization.  Explained that he would be quite weak on discharge and would be several 2 to 3 months to return to his normal stamina.  Also explained lifting restrictions for the first 3 months.  I do not feel that he currently has limb threatening ischemia and therefore  did discuss the option of observation only.  He is quite miserable and unable to do his job or enjoy any activity.  Will proceed with preoperative cardiac clearance in  and then plan aortobifemoral bypass at his convenience   Larina Earthly, MD [facility] V[facility] of [facility] Tel [phone] Pager [phone]

## 2020-06-10 ENCOUNTER — Other Ambulatory Visit: Payer: Self-pay

## 2020-06-15 NOTE — Progress Notes (Signed)
Cardiology Office Note:   Date:  06/16/2020  NAME:  Ronnie Larsen.    MRN: [id number] DOB:  01/31/1956   PCP:  Richardean Chimera, MD  Cardiologist:  No primary care provider on file.   Referring MD: Larina Earthly, MD   Chief Complaint  Patient presents with  . Pre-op Exam   History of Present Illness:   Ronnie Dery. is a 65 y.o. male with a hx of PAD, HLD, tobacco abuse who is being seen today for the evaluation of preoperative assessment at the request of preoperative assessment.  He will undergo her aorta bifemoral bypass surgery on 06/24/2020.  He has severe life limiting claudication symptoms and has been evaluated by vascular surgery.  He is a heavy smoker with nearly 45-pack-year history.  No history of COPD.  His EKG in office demonstrates normal sinus rhythm with heart rate 97 and nonspecific ST-T changes.  He reports no symptoms of chest pain or shortness of breath.  He reports he works in a [employer] and is quite busy.  He reports he is on his feet all day without any major chest pain or shortness of breath.  He does have very limiting claudication symptoms.  He is on aspirin and a cholesterol medication.  Blood pressure also has been a bit elevated as of recently.  He reports that he has whitecoat hypertension.  He has never had a heart attack or stroke.  He reports he does not drink alcohol or use drugs.  Tobacco seems to be the major issue here.  Family history significant for prostate cancer in his brother.  He works as a Merchandiser, retail at [employer].  He has 3 children.  He is divorced.  Problem List 1. PAD -CTO bilateral SFAs -50% infrarenal abdominal aorta -severe ostial IMA dz 2. HLD -Total cholesterol 148, HDL 34, LDL 118, triglycerides 76 3. Tobacco abuse  -45 pack years   Past Medical History: Past Medical History:  Diagnosis Date  . Bladder stones   . PAD (peripheral artery disease) (HCC)   . Urinary tract infection     Past Surgical History: Past  Surgical History:  Procedure Laterality Date  . BLADDER SURGERY    . IR RADIOLOGIST EVAL & MGMT  04/09/2020    Current Medications: Current Meds  Medication Sig  . alfuzosin (UROXATRAL) 10 MG 24 hr tablet Take 1 tablet (10 mg total) by mouth daily with breakfast.  . aspirin EC 81 MG tablet Take 81 mg by mouth daily. Swallow whole.  . cilostazol (PLETAL) 50 MG tablet Take 50 mg by mouth 2 (two) times daily.  Marland Kitchen omeprazole (PRILOSEC) 20 MG capsule Take 20 mg by mouth daily.  . rosuvastatin (CRESTOR) 20 MG tablet Take 20 mg by mouth daily.     Allergies:    Penicillins   Social History: Social History   Socioeconomic History  . Marital status: Divorced    Spouse name: Not on file  . Number of children: 3  . Years of education: Not on file  . Highest education level: Not on file  Occupational History  . Occupation: Naval architect  Tobacco Use  . Smoking status: Current Every Day Smoker    Packs/day: 1.00    Years: 45.00    Pack years: 45.00  . Smokeless tobacco: Never Used  Vaping Use  . Vaping Use: Never used  Substance and Sexual Activity  . Alcohol use: Never  . Drug use: Never  . Sexual activity:  Not on file  Other Topics Concern  . Not on file  Social History Narrative  . Not on file   Social Determinants of Health   Financial Resource Strain: Not on file  Food Insecurity: Not on file  Transportation Needs: Not on file  Physical Activity: Not on file  Stress: Not on file  Social Connections: Not on file    Family History: The patient's family history includes Prostate cancer in his brother.  ROS:   All other ROS reviewed and negative. Pertinent positives noted in the HPI.     EKGs/Labs/Other Studies Reviewed:   The following studies were personally reviewed by me today:  EKG:  EKG is ordered today.  The ekg ordered today demonstrates normal sinus rhythm, nonspecific ST-T changes, and was personally reviewed by me.   Vasc US 04/02/2020 1. Severe right  lower extremity peripheral artery disease with suggestion of most severe stenosis in the superficial femoral artery. Right ABI = 0.46. 2. Moderate left lower extremity peripheral artery disease with suggestion of most severe stenosis in the proximal left superficial femoral artery. Left ABI = 0.65.  CTA 05/08/2020 1. Symmetric, chronic total occlusions of the bilateral superficial femoral arteries from their origins to Hunter's canal. The profunda femoral arteries are patent bilaterally providing distal reconstitution to the patent popliteal and 3-vessel runoff arteries. 2. Aortobiiliac atherosclerotic disease with marked luminal irregularity of the infrarenal abdominal aorta with up to 50% focal stenosis just proximal to the level of the IMA origin. Diminutive bilateral external iliac arteries, measuring up to approximately 3 cm in maximum diameter. 3. Severe ostial stenosis of the IMA ostium secondary to atherosclerotic plaque and moderate ostial and proximal stenosis of the SMA secondary to fibrofatty atherosclerotic changes.  Recent Labs: 05/08/2020: Creatinine, Ser 1.30   Recent Lipid Panel No results found for: CHOL, TRIG, HDL, CHOLHDL, VLDL, LDLCALC, LDLDIRECT  Physical Exam:   VS:  BP (!) 141/72   Pulse 97   Ht 5\' 6"  (1.676 m)   Wt 174 lb (78.9 kg)   SpO2 94%   BMI 28.08 kg/m    Wt Readings from Last 3 Encounters:  06/16/20 174 lb (78.9 kg)  06/09/20 178 lb (80.7 kg)  12/12/19 163 lb (73.9 kg)    General: Well nourished, well developed, in no acute distress Head: Atraumatic, normal size  Eyes: PEERLA, EOMI  Neck: Supple, no JVD Endocrine: No thryomegaly Cardiac: Normal S1, S2; RRR; no murmurs, rubs, or gallops Lungs: Clear to auscultation bilaterally, no wheezing, rhonchi or rales  Abd: Soft, nontender, no hepatomegaly  Ext: No edema, pulses 2+ Musculoskeletal: No deformities, BUE and BLE strength normal and equal Skin: Warm and dry, no rashes   Neuro: Alert  and oriented to person, place, time, and situation, CNII-XII grossly intact, no focal deficits  Psych: Normal mood and affect   ASSESSMENT:   Ronnie Shreve. is a 65 y.o. male who presents for the following: 1. PAD (peripheral artery disease) (HCC)   2. SOB (shortness of breath)   3. Preoperative cardiovascular examination   4. Mixed hyperlipidemia   5. Tobacco abuse     PLAN:   1. PAD (peripheral artery disease) (HCC) 2. Preoperative cardiovascular examination -Bilateral occluded femoral arteries.  He will undergo aorto bifemoral bypass surgery with Dr. 68 on 06/24/2020.  CVD risk factors include heavy smoking history and PAD.  His EKG shows sinus rhythm with nonspecific ST-T changes possibly concerning for lateral ischemia.  He denies any symptoms of angina.  He can complete greater than 4 METS.  However, he will undergo very high risk surgery.  I recommended preoperative stress testing before that time.  We will try to get this done this week.  I will reach out to Dr. Arbie Cookey to let them know. -He will continue aspirin 81 mg a day.  He will continue Crestor 20 mg a day.  He will need follow-up cholesterol levels at some point. -He will also at some point need an echocardiogram.  For now we can determine what his LV function is on nuclear medicine stress testing. -Smoking cessation advised. -Blood pressure elevated in office but he reports he has whitecoat hypertension.  He should monitor this at home.  3. Mixed hyperlipidemia -Continue Crestor 20 mg a day.  Further titration after his surgery.  Goal LDL cholesterol less than 70.  4. Tobacco abuse -Smoking cessation counseling provided.  Shared Decision Making/Informed Consent The risks [chest pain, shortness of breath, cardiac arrhythmias, dizziness, blood pressure fluctuations, myocardial infarction, stroke/transient ischemic attack, nausea, vomiting, allergic reaction, radiation exposure, metallic taste sensation and  life-threatening complications (estimated to be 1 in 10,000)], benefits (risk stratification, diagnosing coronary artery disease, treatment guidance) and alternatives of a nuclear stress test were discussed in detail with Ronnie Larsen and he agrees to proceed.  Disposition: Return in about 6 months (around 12/14/2020).  Medication Adjustments/Labs and Tests Ordered: Current medicines are reviewed at length with the patient today.  Concerns regarding medicines are outlined above.  Orders Placed This Encounter  Procedures  . MYOCARDIAL PERFUSION IMAGING   No orders of the defined types were placed in this encounter.   Patient Instructions  Medication Instructions:  The current medical regimen is effective;  continue present plan and medications.  *If you need a refill on your cardiac medications before your next appointment, please call your pharmacy*  Testing/Procedures: Your physician has requested that you have a lexiscan myoview. A cardiac stress test is a cardiological test that measures the heart's ability to respond to external stress in a controlled clinical environment. The stress response is induced by intravenous pharmacological stimulation.    Follow-Up: At [facility], you and your health needs are our priority.  As part of our continuing mission to provide you with exceptional heart care, we have created designated Provider Care Teams.  These Care Teams include your primary Cardiologist (physician) and Advanced Practice Providers (APPs -  Physician Assistants and Nurse Practitioners) who all work together to provide you with the care you need, when you need it.  We recommend signing up for the patient portal called "MyChart".  Sign up information is provided on this After Visit Summary.  MyChart is used to connect with patients for Virtual Visits (Telemedicine).  Patients are able to view lab/test results, encounter notes, upcoming appointments, etc.  Non-urgent messages can be  sent to your provider as well.   To learn more about what you can do with MyChart, go to [email or web address].    Your next appointment:   6 month(s)  The format for your next appointment:   In Person  Provider:   Lennie Odor, MD        Signed, Lenna Gilford. Flora Lipps, MD [facility]  [address], [address] [place], [place] [address] [phone]  06/16/2020 2:44 PM

## 2020-06-16 ENCOUNTER — Encounter: Payer: Self-pay | Admitting: Cardiovascular Disease

## 2020-06-16 ENCOUNTER — Other Ambulatory Visit: Payer: Self-pay

## 2020-06-16 ENCOUNTER — Ambulatory Visit: Payer: BC Managed Care – PPO | Admitting: Cardiovascular Disease

## 2020-06-16 VITALS — BP 141/72 | HR 97 | Ht 66.0 in | Wt 174.0 lb

## 2020-06-16 DIAGNOSIS — I739 Peripheral vascular disease, unspecified: Secondary | ICD-10-CM | POA: Diagnosis not present

## 2020-06-16 DIAGNOSIS — E782 Mixed hyperlipidemia: Secondary | ICD-10-CM | POA: Diagnosis not present

## 2020-06-16 DIAGNOSIS — Z720 Tobacco use: Secondary | ICD-10-CM | POA: Diagnosis not present

## 2020-06-16 DIAGNOSIS — Z01810 Encounter for preprocedural cardiovascular examination: Secondary | ICD-10-CM

## 2020-06-16 DIAGNOSIS — R0602 Shortness of breath: Secondary | ICD-10-CM | POA: Diagnosis not present

## 2020-06-16 NOTE — Patient Instructions (Signed)
Medication Instructions:  The current medical regimen is effective;  continue present plan and medications.  *If you need a refill on your cardiac medications before your next appointment, please call your pharmacy*  Testing/Procedures: Your physician has requested that you have a lexiscan myoview. A cardiac stress test is a cardiological test that measures the heart's ability to respond to external stress in a controlled clinical environment. The stress response is induced by intravenous pharmacological stimulation.    Follow-Up: At [facility], you and your health needs are our priority.  As part of our continuing mission to provide you with exceptional heart care, we have created designated Provider Care Teams.  These Care Teams include your primary Cardiologist (physician) and Advanced Practice Providers (APPs -  Physician Assistants and Nurse Practitioners) who all work together to provide you with the care you need, when you need it.  We recommend signing up for the patient portal called "MyChart".  Sign up information is provided on this After Visit Summary.  MyChart is used to connect with patients for Virtual Visits (Telemedicine).  Patients are able to view lab/test results, encounter notes, upcoming appointments, etc.  Non-urgent messages can be sent to your provider as well.   To learn more about what you can do with MyChart, go to [email or web address].    Your next appointment:   6 month(s)  The format for your next appointment:   In Person  Provider:   Lennie Odor, MD

## 2020-06-17 NOTE — Addendum Note (Signed)
Addended by: Felecia Jan on: 06/17/2020 11:23 AM   Modules accepted: Orders

## 2020-06-18 ENCOUNTER — Telehealth (HOSPITAL_COMMUNITY): Payer: Self-pay | Admitting: *Deleted

## 2020-06-18 NOTE — Telephone Encounter (Signed)
Close encounter 

## 2020-06-19 ENCOUNTER — Other Ambulatory Visit: Payer: Self-pay

## 2020-06-19 ENCOUNTER — Ambulatory Visit (HOSPITAL_COMMUNITY)
Admission: RE | Admit: 2020-06-19 | Discharge: 2020-06-19 | Disposition: A | Discharge status: Home / Self Care | Payer: BC Managed Care – PPO | Source: Ambulatory Visit | Attending: Cardiovascular Disease | Admitting: Cardiovascular Disease

## 2020-06-19 DIAGNOSIS — R0602 Shortness of breath: Secondary | ICD-10-CM

## 2020-06-19 LAB — MYOCARDIAL PERFUSION IMAGING
LV dias vol: 69 mL (ref 62–150)
LV sys vol: 26 mL
Peak HR: 106 {beats}/min
Rest HR: 76 {beats}/min
SDS: 3
SRS: 1
SSS: 4
TID: 0.9

## 2020-06-19 MED ORDER — REGADENOSON 0.4 MG/5ML IV SOLN
0.4000 mg | Freq: Once | INTRAVENOUS | Status: AC
  Administered 2020-06-19: 0.4 mg via INTRAVENOUS

## 2020-06-19 MED ORDER — TECHNETIUM TC 99M TETROFOSMIN IV KIT
32.4000 | PACK | Freq: Once | INTRAVENOUS | Status: AC | PRN
  Administered 2020-06-19: 32.4 via INTRAVENOUS
  Filled 2020-06-19: qty 33

## 2020-06-19 MED ORDER — TECHNETIUM TC 99M TETROFOSMIN IV KIT
10.3000 | PACK | Freq: Once | INTRAVENOUS | Status: AC | PRN
  Administered 2020-06-19: 10.3 via INTRAVENOUS
  Filled 2020-06-19: qty 11

## 2020-06-23 ENCOUNTER — Other Ambulatory Visit (HOSPITAL_COMMUNITY)
Admission: RE | Admit: 2020-06-23 | Discharge: 2020-06-23 | Disposition: A | Discharge status: Home / Self Care | Payer: BC Managed Care – PPO | Source: Ambulatory Visit | Attending: Vascular Surgery | Admitting: Vascular Surgery

## 2020-06-23 ENCOUNTER — Other Ambulatory Visit: Payer: Self-pay

## 2020-06-23 ENCOUNTER — Encounter (HOSPITAL_COMMUNITY)
Admission: RE | Admit: 2020-06-23 | Discharge: 2020-06-23 | Disposition: A | Discharge status: Home / Self Care | Payer: BC Managed Care – PPO | Source: Ambulatory Visit | Attending: Vascular Surgery | Admitting: Vascular Surgery

## 2020-06-23 ENCOUNTER — Encounter (HOSPITAL_COMMUNITY): Payer: Self-pay

## 2020-06-23 DIAGNOSIS — Z01812 Encounter for preprocedural laboratory examination: Secondary | ICD-10-CM | POA: Insufficient documentation

## 2020-06-23 DIAGNOSIS — Z20822 Contact with and (suspected) exposure to covid-19: Secondary | ICD-10-CM | POA: Insufficient documentation

## 2020-06-23 HISTORY — DX: Gastro-esophageal reflux disease without esophagitis: K21.9

## 2020-06-23 HISTORY — DX: Pure hypercholesterolemia, unspecified: E78.00

## 2020-06-23 HISTORY — DX: Gout, unspecified: M10.9

## 2020-06-23 LAB — COMPREHENSIVE METABOLIC PANEL
ALT: 51 U/L — ABNORMAL HIGH (ref 0–44)
AST: 33 U/L (ref 15–41)
Albumin: 4.1 g/dL (ref 3.5–5.0)
Alkaline Phosphatase: 66 U/L (ref 38–126)
Anion gap: 13 (ref 5–15)
BUN: 8 mg/dL (ref 8–23)
CO2: 20 mmol/L — ABNORMAL LOW (ref 22–32)
Calcium: 9.4 mg/dL (ref 8.9–10.3)
Chloride: 108 mmol/L (ref 98–111)
Creatinine, Ser: 1.06 mg/dL (ref 0.61–1.24)
GFR, Estimated: >60 mL/min (ref >60)
Glucose, Bld: 116 mg/dL — ABNORMAL HIGH (ref 70–99)
Potassium: 4.5 mmol/L (ref 3.5–5.1)
Sodium: 141 mmol/L (ref 135–145)
Total Bilirubin: 0.8 mg/dL (ref 0.3–1.2)
Total Protein: 7.1 g/dL (ref 6.5–8.1)

## 2020-06-23 LAB — CBC
HCT: 51.9 % (ref 39.0–52.0)
Hemoglobin: 17.9 g/dL — ABNORMAL HIGH (ref 13.0–17.0)
MCH: 31.6 pg (ref 26.0–34.0)
MCHC: 34.5 g/dL (ref 30.0–36.0)
MCV: 91.7 fL (ref 80.0–100.0)
Platelets: 162 10*3/uL (ref 150–400)
RBC: 5.66 MIL/uL (ref 4.22–5.81)
RDW: 12 % (ref 11.5–15.5)
WBC: 7.8 10*3/uL (ref 4.0–10.5)
nRBC: 0 % (ref 0.0–0.2)

## 2020-06-23 LAB — URINALYSIS, ROUTINE W REFLEX MICROSCOPIC
Bilirubin Urine: NEGATIVE
Glucose, UA: NEGATIVE mg/dL
Hgb urine dipstick: NEGATIVE
Ketones, ur: NEGATIVE mg/dL
Leukocytes,Ua: NEGATIVE
Nitrite: NEGATIVE
Protein, ur: NEGATIVE mg/dL
Specific Gravity, Urine: 1.014 (ref 1.005–1.030)
pH: 8 (ref 5.0–8.0)

## 2020-06-23 LAB — APTT: aPTT: 30 seconds (ref 24–36)

## 2020-06-23 LAB — PROTIME-INR
INR: 1 (ref 0.8–1.2)
Prothrombin Time: 13.1 seconds (ref 11.4–15.2)

## 2020-06-23 LAB — BLOOD GAS, ARTERIAL
Acid-base deficit: 1 mmol/L (ref 0.0–2.0)
Bicarbonate: 23 mmol/L (ref 20.0–28.0)
Drawn by: 602861
FIO2: 21
O2 Saturation: 96.9 %
Patient temperature: 37
pCO2 arterial: 36.8 mmHg (ref 32.0–48.0)
pH, Arterial: 7.412 (ref 7.350–7.450)
pO2, Arterial: 80.4 mmHg — ABNORMAL LOW (ref 83.0–108.0)

## 2020-06-23 LAB — SURGICAL PCR SCREEN
MRSA, PCR: NEGATIVE
Staphylococcus aureus: NEGATIVE

## 2020-06-23 LAB — SARS CORONAVIRUS 2 (TAT 6-24 HRS): SARS Coronavirus 2: NEGATIVE

## 2020-06-23 NOTE — Pre-Procedure Instructions (Signed)
Ronnie Larsen.  06/23/2020     Your procedure is scheduled on Tuesday, February., 8.  Report to [facility] Admitting, Entrance  'A' at 5:30 AM                   Your surgery or procedure is scheduled to begin at  7:30  A.M.   Call this number if you have problems the morning of surgery: [phone]  This is the number for the Pre- Surgical Desk.    Remember:  Do not eat or drink after midnight.  Take these medicines the morning of surgery with A SIP OF WATER:  alfuzosin (UROXATRAL)              aspirin              cilostazol (PLETAL)             omeprazole (PRILOSEC)             rosuvastatin (CRESTOR)  Follow Dr.Early's instructions regarding Aspirin.   STOP taking  Aspirin Products (Goody Powder, Excedrin Migraine), Ibuprofen (Advil), Naproxen (Aleve), Vitamins and Herbal Products (ie Fish Oil).  Special instructions: If you smoke DO NOT smoke within 24 hours  Prior to surgery   [facility]- Preparing For Surgery  Before surgery, you can play an important role. Because skin is not sterile, your skin needs to be as free of germs as possible. You can reduce the number of germs on your skin by washing with CHG (chlorahexidine gluconate) Soap before surgery.  CHG is an antiseptic cleaner which kills germs and bonds with the skin to continue killing germs even after washing.    Oral Hygiene is also important to reduce your risk of infection.  Remember - BRUSH YOUR TEETH THE MORNING OF SURGERY WITH YOUR REGULAR TOOTHPASTE  Please do not use if you have an allergy to CHG or antibacterial soaps. If your skin becomes reddened/irritated stop using the CHG.  Do not shave (including legs and underarms) for at least 48 hours prior to first CHG shower. It is OK to shave your face.  Please follow these instructions carefully.   1. Shower the NIGHT BEFORE SURGERY and the MORNING OF SURGERY with CHG.   2. If you chose to wash your hair, wash your hair first as usual with  your normal shampoo.  3. After you shampoo, wash your face and private area with the soap you use at home, then rinse your hair and body thoroughly to remove the shampoo and soap.  4. Use CHG as you would any other liquid soap. You can apply CHG directly to the skin and wash gently with a scrungie or a clean washcloth.   5. Apply the CHG Soap to your body ONLY FROM THE NECK DOWN.  Do not use on open wounds or open sores. Avoid contact with   6. Wash thoroughly, paying special attention to the area where your surgery will be performed.  7. Thoroughly rinse your body with warm water from the neck down.  8. DO NOT shower/wash with your normal soap after using and rinsing off the CHG Soap.  9. Pat yourself dry with a CLEAN TOWEL.  10. Wear CLEAN PAJAMAS to bed the night before surgery, wear comfortable clothes the morning of surgery  11. Place CLEAN SHEETS on your bed the night of your first shower and DO NOT SLEEP WITH PETS.  Day of Surgery:Shower as instructed above. Do not  wear lotions, powders, or perfumes, or deodorant. Please wear clean clothes to the hospital/surgery center.   Remember to brush your teeth WITH YOUR REGULAR TOOTHPASTE.   Do not wear jewelry, make-up or nail polish.  Do not wear lotions, powders, or cologne or deodorant.             Men may shave face and neck.  Do not bring valuables to the hospital.  [facility] is not responsible for any belongings or valuables.  Contacts, dentures or bridgework may not be worn into surgery.  Leave your suitcase in the car.  After surgery it may be brought to your room.  For patients admitted to the hospital, discharge time will be determined by your treatment team.  Patients discharged the day of surgery will not be allowed to drive home.   Please read over the  fact sheets that you were given.

## 2020-06-23 NOTE — Progress Notes (Signed)
PCP - Dr. Reuel Boom Cardiologist - Dr. Lennie Odor  PPM/ICD - n/a Device Orders -  Rep Notified -   Chest x-ray - n/a EKG - 06/16/20 Stress Test - 06/16/20 ECHO - patient denies Cardiac Cath - patient denies  Sleep Study - n/a, negative stop bang CPAP -   Fasting Blood Sugar - n/a Checks Blood Sugar _____ times a day  Blood Thinner Instructions: patient states he did not receive any instructions to stop Pletal.  Per instructions from Trinna Post, RN at Dr. Bosie Helper office, have patient hold morning of surgery Aspirin Instructions: continue asa  ERAS Protcol - npo after midnight PRE-SURGERY Ensure or G2-   COVID TEST- 06/23/20   Anesthesia review: yes, recent cards consult and stress test.  Patient denies shortness of breath, fever, cough and chest pain at PAT appointment.  Patient's BP elevated at PAT appointment.  Patient is not currently on any BP meds.  It was noted by Dr. Arbie Cookey and cardiology that BP has been recently elevated.  Anesthesia also made aware at PAT appointment.   All instructions explained to the patient, with a verbal understanding of the material. Patient agrees to go over the instructions while at home for a better understanding. Patient also instructed to self quarantine after being tested for COVID-19. The opportunity to ask questions was provided.

## 2020-06-23 NOTE — Anesthesia Preprocedure Evaluation (Addendum)
Anesthesia Evaluation  Patient identified by MRN, date of birth, ID band Patient awake    Reviewed: Allergy & Precautions, NPO status , Patient's Chart, lab work & pertinent test results  Airway Mallampati: III  TM Distance: >3 FB Neck ROM: Full    Dental  (+) Dental Advisory Given, Missing   Pulmonary Current Smoker and Patient abstained from smoking.,    Pulmonary exam normal breath sounds clear to auscultation       Cardiovascular + Peripheral Vascular Disease  Normal cardiovascular exam Rhythm:Regular Rate:Normal  Nuclear stress 06/19/2020: Nuclear stress EF: 63%. The left ventricular ejection fraction is normal (55-65%). There was no ST segment deviation noted during stress. The study is normal. This is a low risk study.   Neuro/Psych negative neurological ROS     GI/Hepatic Neg liver ROS, GERD  ,  Endo/Other  negative endocrine ROS  Renal/GU negative Renal ROS   Bladder stones     Musculoskeletal negative musculoskeletal ROS (+)   Abdominal   Peds  Hematology negative hematology ROS (+)   Anesthesia Other Findings   Reproductive/Obstetrics                           Anesthesia Physical Anesthesia Plan  ASA: IV  Anesthesia Plan: General   Post-op Pain Management:    Induction: Intravenous  PONV Risk Score and Plan: 3 and Midazolam, Dexamethasone and Ondansetron  Airway Management Planned: Oral ETT  Additional Equipment: Arterial line, CVP and Ultrasound Guidance Line Placement  Intra-op Plan:   Post-operative Plan: Possible Post-op intubation/ventilation  Informed Consent: I have reviewed the patients History and Physical, chart, labs and discussed the procedure including the risks, benefits and alternatives for the proposed anesthesia with the patient or authorized representative who has indicated his/her understanding and acceptance.     Dental advisory  given  Plan Discussed with: CRNA  Anesthesia Plan Comments: (PAT note by Antionette Poles, PA-C: Patient was referred by Dr. Arbie Cookey for preop cardiovascular evaluation.  He was seen by Dr. Flora Lipps on 06/16/2020.  Due to CVD risk factors including heavy smoking history, PAD, and nonspecific ST-T changes on EKG, preoperative stress testing was recommended.  Nuclear stress 06/19/2020 was nonischemic, low risk, EF 63%.  Dr. Flora Lipps commented on result stating patient is okay to proceed with surgery.  Patient does not have a diagnosis of hypertension but does report a history of whitecoat hypertension.  This was noted in both vascular surgery and cardiology notes. BP Readings from Last 3 Encounters: 06/23/20 (!) 172/85 06/16/20 (!) 141/72 06/09/20 (!) 172/100  Preop labs reviewed, unremarkable.  EKG 06/16/2020: NSR.  Rate 97.  ST and T wave normality, consider lateral ischemia.  CT lung cancer screening 12/13/2019 (Care Everywhere): 1. Lung-RADS 2, benign appearance or behavior. Continue annual  screening with low-dose chest CT without contrast in 12 months.  2. Hepatic steatosis.  3. Aortic Atherosclerosis (ICD10-I70.0) and Emphysema (ICD10-J43.9).   Nuclear stress 06/19/2020: Nuclear stress EF: 63%. The left ventricular ejection fraction is normal (55-65%). There was no ST segment deviation noted during stress. The study is normal. This is a low risk study.   No ischemia or infarction on perfusion images. Normal wall motion. Low risk study.  )      Anesthesia Quick Evaluation

## 2020-06-23 NOTE — Progress Notes (Signed)
Anesthesia Chart Review:  Patient was referred by Dr. Arbie Cookey for preop cardiovascular evaluation.  He was seen by Dr. Flora Lipps on 06/16/2020.  Due to CVD risk factors including heavy smoking history, PAD, and nonspecific ST-T changes on EKG, preoperative stress testing was recommended.  Nuclear stress 06/19/2020 was nonischemic, low risk, EF 63%.  Dr. Flora Lipps commented on result stating patient is okay to proceed with surgery.  Patient does not have a diagnosis of hypertension but does report a history of whitecoat hypertension.  This was noted in both vascular surgery and cardiology notes. BP Readings from Last 3 Encounters:  06/23/20 (!) 172/85  06/16/20 (!) 141/72  06/09/20 (!) 172/100   Preop labs reviewed, unremarkable.  EKG 06/16/2020: NSR.  Rate 97.  ST and T wave normality, consider lateral ischemia.  CT lung cancer screening 12/13/2019 (Care Everywhere): 1. Lung-RADS 2, benign appearance or behavior. Continue annual  screening with low-dose chest CT without contrast in 12 months.  2. Hepatic steatosis.  3. Aortic Atherosclerosis (ICD10-I70.0) and Emphysema (ICD10-J43.9).   Nuclear stress 06/19/2020:  Nuclear stress EF: 63%.  The left ventricular ejection fraction is normal (55-65%).  There was no ST segment deviation noted during stress.  The study is normal.  This is a low risk study.   No ischemia or infarction on perfusion images. Normal wall motion. Low risk study.    Zannie Cove [facility] Short Stay Center/Anesthesiology Phone [phone] 06/23/2020 1:30 PM

## 2020-06-24 ENCOUNTER — Inpatient Hospital Stay (HOSPITAL_COMMUNITY): Payer: BC Managed Care – PPO | Admitting: Certified Registered"

## 2020-06-24 ENCOUNTER — Inpatient Hospital Stay (HOSPITAL_COMMUNITY)
Admission: RE | Admit: 2020-06-24 | Discharge: 2020-06-29 | DRG: 271 | Disposition: A | Discharge status: Home / Self Care | Payer: BC Managed Care – PPO | Attending: Vascular Surgery | Admitting: Vascular Surgery

## 2020-06-24 ENCOUNTER — Inpatient Hospital Stay (HOSPITAL_COMMUNITY): Payer: BC Managed Care – PPO

## 2020-06-24 ENCOUNTER — Encounter (HOSPITAL_COMMUNITY): Payer: Self-pay | Admitting: Vascular Surgery

## 2020-06-24 ENCOUNTER — Inpatient Hospital Stay (HOSPITAL_COMMUNITY): Payer: BC Managed Care – PPO | Admitting: Physician Assistant

## 2020-06-24 ENCOUNTER — Other Ambulatory Visit: Payer: Self-pay

## 2020-06-24 ENCOUNTER — Encounter (HOSPITAL_COMMUNITY)
Admission: RE | Disposition: A | Discharge status: Home / Self Care | Payer: Self-pay | Source: Home / Self Care | Attending: Vascular Surgery

## 2020-06-24 DIAGNOSIS — I70211 Atherosclerosis of native arteries of extremities with intermittent claudication, right leg: Principal | ICD-10-CM | POA: Diagnosis present

## 2020-06-24 DIAGNOSIS — Z7901 Long term (current) use of anticoagulants: Secondary | ICD-10-CM | POA: Diagnosis not present

## 2020-06-24 DIAGNOSIS — F17210 Nicotine dependence, cigarettes, uncomplicated: Secondary | ICD-10-CM | POA: Diagnosis present

## 2020-06-24 DIAGNOSIS — I7092 Chronic total occlusion of artery of the extremities: Secondary | ICD-10-CM | POA: Diagnosis present

## 2020-06-24 DIAGNOSIS — Z95828 Presence of other vascular implants and grafts: Secondary | ICD-10-CM

## 2020-06-24 DIAGNOSIS — Z20822 Contact with and (suspected) exposure to covid-19: Secondary | ICD-10-CM | POA: Diagnosis present

## 2020-06-24 DIAGNOSIS — I97638 Postprocedural hematoma of a circulatory system organ or structure following other circulatory system procedure: Secondary | ICD-10-CM | POA: Diagnosis not present

## 2020-06-24 DIAGNOSIS — D696 Thrombocytopenia, unspecified: Secondary | ICD-10-CM | POA: Diagnosis not present

## 2020-06-24 DIAGNOSIS — I739 Peripheral vascular disease, unspecified: Secondary | ICD-10-CM

## 2020-06-24 DIAGNOSIS — K219 Gastro-esophageal reflux disease without esophagitis: Secondary | ICD-10-CM | POA: Diagnosis present

## 2020-06-24 DIAGNOSIS — E7800 Pure hypercholesterolemia, unspecified: Secondary | ICD-10-CM | POA: Diagnosis present

## 2020-06-24 DIAGNOSIS — D62 Acute posthemorrhagic anemia: Secondary | ICD-10-CM | POA: Diagnosis not present

## 2020-06-24 DIAGNOSIS — Z79899 Other long term (current) drug therapy: Secondary | ICD-10-CM

## 2020-06-24 DIAGNOSIS — I7409 Other arterial embolism and thrombosis of abdominal aorta: Secondary | ICD-10-CM | POA: Diagnosis present

## 2020-06-24 DIAGNOSIS — L7632 Postprocedural hematoma of skin and subcutaneous tissue following other procedure: Secondary | ICD-10-CM | POA: Diagnosis not present

## 2020-06-24 DIAGNOSIS — R262 Difficulty in walking, not elsewhere classified: Secondary | ICD-10-CM | POA: Diagnosis present

## 2020-06-24 DIAGNOSIS — M109 Gout, unspecified: Secondary | ICD-10-CM | POA: Diagnosis present

## 2020-06-24 DIAGNOSIS — Y838 Other surgical procedures as the cause of abnormal reaction of the patient, or of later complication, without mention of misadventure at the time of the procedure: Secondary | ICD-10-CM | POA: Diagnosis not present

## 2020-06-24 HISTORY — PX: PATCH ANGIOPLASTY: SHX6230

## 2020-06-24 HISTORY — PX: AORTA - BILATERAL FEMORAL ARTERY BYPASS GRAFT: SHX1175

## 2020-06-24 HISTORY — DX: Other specified health status: Z78.9

## 2020-06-24 LAB — BLOOD GAS, ARTERIAL
Acid-base deficit: 4.1 mmol/L — ABNORMAL HIGH (ref 0.0–2.0)
Bicarbonate: 21.1 mmol/L (ref 20.0–28.0)
Drawn by: 35493
FIO2: 40
O2 Saturation: 97.8 %
Patient temperature: 36.6
pCO2 arterial: 42.2 mmHg (ref 32.0–48.0)
pH, Arterial: 7.318 — ABNORMAL LOW (ref 7.350–7.450)
pO2, Arterial: 107 mmHg (ref 83.0–108.0)

## 2020-06-24 LAB — CBC
HCT: 44.1 % (ref 39.0–52.0)
Hemoglobin: 14.4 g/dL (ref 13.0–17.0)
MCH: 30.7 pg (ref 26.0–34.0)
MCHC: 32.7 g/dL (ref 30.0–36.0)
MCV: 94 fL (ref 80.0–100.0)
Platelets: 168 10*3/uL (ref 150–400)
RBC: 4.69 MIL/uL (ref 4.22–5.81)
RDW: 12.3 % (ref 11.5–15.5)
WBC: 19.3 10*3/uL — ABNORMAL HIGH (ref 4.0–10.5)
nRBC: 0 % (ref 0.0–0.2)

## 2020-06-24 LAB — APTT: aPTT: 26 seconds (ref 24–36)

## 2020-06-24 LAB — BASIC METABOLIC PANEL
Anion gap: 13 (ref 5–15)
BUN: 7 mg/dL — ABNORMAL LOW (ref 8–23)
CO2: 20 mmol/L — ABNORMAL LOW (ref 22–32)
Calcium: 8.1 mg/dL — ABNORMAL LOW (ref 8.9–10.3)
Chloride: 105 mmol/L (ref 98–111)
Creatinine, Ser: 1.47 mg/dL — ABNORMAL HIGH (ref 0.61–1.24)
GFR, Estimated: 54 mL/min — ABNORMAL LOW (ref >60)
Glucose, Bld: 168 mg/dL — ABNORMAL HIGH (ref 70–99)
Potassium: 4.1 mmol/L (ref 3.5–5.1)
Sodium: 138 mmol/L (ref 135–145)

## 2020-06-24 LAB — GLUCOSE, CAPILLARY
Glucose-Capillary: 151 mg/dL — ABNORMAL HIGH (ref 70–99)
Glucose-Capillary: 196 mg/dL — ABNORMAL HIGH (ref 70–99)
Glucose-Capillary: 174 mg/dL — ABNORMAL HIGH (ref 70–99)

## 2020-06-24 LAB — PROTIME-INR
INR: 1.3 — ABNORMAL HIGH (ref 0.8–1.2)
Prothrombin Time: 15.8 seconds — ABNORMAL HIGH (ref 11.4–15.2)

## 2020-06-24 LAB — PREPARE RBC (CROSSMATCH)

## 2020-06-24 LAB — MAGNESIUM: Magnesium: 1.7 mg/dL (ref 1.7–2.4)

## 2020-06-24 LAB — ABO/RH: ABO/RH(D): A POS

## 2020-06-24 SURGERY — CREATION, BYPASS, ARTERIAL, AORTA TO FEMORAL, BILATERAL, USING GRAFT
Anesthesia: General | Site: Groin | Laterality: Right

## 2020-06-24 MED ORDER — DOCUSATE SODIUM 100 MG PO CAPS
100.0000 mg | ORAL_CAPSULE | Freq: Every day | ORAL | Status: DC
  Administered 2020-06-26 – 2020-06-29 (×4): 100 mg via ORAL
  Filled 2020-06-24 (×4): qty 1

## 2020-06-24 MED ORDER — PHENYLEPHRINE 40 MCG/ML (10ML) SYRINGE FOR IV PUSH (FOR BLOOD PRESSURE SUPPORT)
PREFILLED_SYRINGE | INTRAVENOUS | Status: AC
  Filled 2020-06-24: qty 20

## 2020-06-24 MED ORDER — SODIUM CHLORIDE 0.9 % IV SOLN: 500.0000 mL | Freq: Once | INTRAVENOUS | Status: DC | PRN

## 2020-06-24 MED ORDER — ONDANSETRON HCL 4 MG/2ML IJ SOLN
INTRAMUSCULAR | Status: AC
  Filled 2020-06-24: qty 2

## 2020-06-24 MED ORDER — FENTANYL CITRATE (PF) 250 MCG/5ML IJ SOLN
INTRAMUSCULAR | Status: DC | PRN
  Administered 2020-06-24 (×5): 50 ug via INTRAVENOUS
  Administered 2020-06-24: 100 ug via INTRAVENOUS
  Administered 2020-06-24: 50 ug via INTRAVENOUS
  Administered 2020-06-24: 100 ug via INTRAVENOUS
  Administered 2020-06-24 (×2): 50 ug via INTRAVENOUS

## 2020-06-24 MED ORDER — CLEVIDIPINE BUTYRATE 0.5 MG/ML IV EMUL
1.0000 mg/h | INTRAVENOUS | Status: AC
  Administered 2020-06-24: 1 mg/h via INTRAVENOUS
  Filled 2020-06-24: qty 50

## 2020-06-24 MED ORDER — ALBUMIN HUMAN 5 % IV SOLN: INTRAVENOUS | Status: DC | PRN

## 2020-06-24 MED ORDER — MIDAZOLAM HCL 5 MG/5ML IJ SOLN
INTRAMUSCULAR | Status: DC | PRN
  Administered 2020-06-24 (×2): 1 mg via INTRAVENOUS

## 2020-06-24 MED ORDER — FENTANYL CITRATE (PF) 250 MCG/5ML IJ SOLN
INTRAMUSCULAR | Status: AC
  Filled 2020-06-24: qty 10

## 2020-06-24 MED ORDER — CHLORHEXIDINE GLUCONATE CLOTH 2 % EX PADS: 6.0000 | MEDICATED_PAD | Freq: Once | CUTANEOUS | Status: DC

## 2020-06-24 MED ORDER — VANCOMYCIN HCL 1000 MG IV SOLR
INTRAVENOUS | Status: DC | PRN
  Administered 2020-06-24: 1000 mg via INTRAVENOUS

## 2020-06-24 MED ORDER — POTASSIUM CHLORIDE CRYS ER 20 MEQ PO TBCR
20.0000 meq | EXTENDED_RELEASE_TABLET | Freq: Once | ORAL | Status: DC | PRN
Start: 2020-06-24 — End: 2020-06-29

## 2020-06-24 MED ORDER — 0.9 % SODIUM CHLORIDE (POUR BTL) OPTIME
TOPICAL | Status: DC | PRN
  Administered 2020-06-24 (×2): 1000 mL

## 2020-06-24 MED ORDER — ROCURONIUM BROMIDE 10 MG/ML (PF) SYRINGE
PREFILLED_SYRINGE | INTRAVENOUS | Status: DC | PRN
  Administered 2020-06-24: 20 mg via INTRAVENOUS
  Administered 2020-06-24: 30 mg via INTRAVENOUS
  Administered 2020-06-24: 80 mg via INTRAVENOUS
  Administered 2020-06-24: 70 mg via INTRAVENOUS
  Administered 2020-06-24: 20 mg via INTRAVENOUS

## 2020-06-24 MED ORDER — INSULIN ASPART 100 UNIT/ML ~~LOC~~ SOLN
10.0000 [IU] | SUBCUTANEOUS | Status: AC
  Administered 2020-06-24: 10 [IU] via SUBCUTANEOUS
  Filled 2020-06-24: qty 0.1

## 2020-06-24 MED ORDER — HYDRALAZINE HCL 20 MG/ML IJ SOLN: 5.0000 mg | INTRAMUSCULAR | Status: DC | PRN

## 2020-06-24 MED ORDER — METOPROLOL TARTRATE 5 MG/5ML IV SOLN
2.5000 mg | Freq: Four times a day (QID) | INTRAVENOUS | Status: DC
  Administered 2020-06-24 – 2020-06-29 (×19): 2.5 mg via INTRAVENOUS
  Filled 2020-06-24 (×18): qty 5

## 2020-06-24 MED ORDER — MORPHINE SULFATE (PF) 2 MG/ML IV SOLN
2.0000 mg | INTRAVENOUS | Status: DC | PRN
  Administered 2020-06-24 – 2020-06-28 (×16): 2 mg via INTRAVENOUS
  Filled 2020-06-24 (×16): qty 1

## 2020-06-24 MED ORDER — MANNITOL 25 % IV SOLN
INTRAVENOUS | Status: DC | PRN
  Administered 2020-06-24: 25 g via INTRAVENOUS

## 2020-06-24 MED ORDER — SODIUM CHLORIDE 0.9 % IV SOLN
INTRAVENOUS | Status: DC | PRN
  Administered 2020-06-24: 500 mL

## 2020-06-24 MED ORDER — PROMETHAZINE HCL 25 MG/ML IJ SOLN: 6.2500 mg | INTRAMUSCULAR | Status: DC | PRN

## 2020-06-24 MED ORDER — DEXAMETHASONE SODIUM PHOSPHATE 10 MG/ML IJ SOLN
INTRAMUSCULAR | Status: AC
  Filled 2020-06-24: qty 1

## 2020-06-24 MED ORDER — LIDOCAINE 2% (20 MG/ML) 5 ML SYRINGE
INTRAMUSCULAR | Status: AC
  Filled 2020-06-24: qty 5

## 2020-06-24 MED ORDER — MAGNESIUM SULFATE 2 GM/50ML IV SOLN
2.0000 g | Freq: Once | INTRAVENOUS | Status: AC | PRN
  Administered 2020-06-25: 2 g via INTRAVENOUS
  Filled 2020-06-24: qty 50

## 2020-06-24 MED ORDER — VASOPRESSIN 20 UNIT/ML IV SOLN
INTRAVENOUS | Status: AC
  Filled 2020-06-24: qty 1

## 2020-06-24 MED ORDER — ALBUTEROL SULFATE HFA 108 (90 BASE) MCG/ACT IN AERS
INHALATION_SPRAY | RESPIRATORY_TRACT | Status: AC
  Filled 2020-06-24: qty 6.7

## 2020-06-24 MED ORDER — LABETALOL HCL 5 MG/ML IV SOLN
INTRAVENOUS | Status: AC
  Filled 2020-06-24: qty 4

## 2020-06-24 MED ORDER — CHLORHEXIDINE GLUCONATE CLOTH 2 % EX PADS
6.0000 | MEDICATED_PAD | Freq: Every day | CUTANEOUS | Status: DC
  Administered 2020-06-26 – 2020-06-28 (×3): 6 via TOPICAL

## 2020-06-24 MED ORDER — DEXAMETHASONE SODIUM PHOSPHATE 10 MG/ML IJ SOLN
INTRAMUSCULAR | Status: DC | PRN
  Administered 2020-06-24: 10 mg via INTRAVENOUS

## 2020-06-24 MED ORDER — HEPARIN SODIUM (PORCINE) 1000 UNIT/ML IJ SOLN
INTRAMUSCULAR | Status: AC
  Filled 2020-06-24: qty 2

## 2020-06-24 MED ORDER — FENTANYL CITRATE (PF) 250 MCG/5ML IJ SOLN
INTRAMUSCULAR | Status: AC
  Filled 2020-06-24: qty 5

## 2020-06-24 MED ORDER — ALBUTEROL SULFATE HFA 108 (90 BASE) MCG/ACT IN AERS
INHALATION_SPRAY | RESPIRATORY_TRACT | Status: DC | PRN
  Administered 2020-06-24: 8 via RESPIRATORY_TRACT

## 2020-06-24 MED ORDER — PHENYLEPHRINE 40 MCG/ML (10ML) SYRINGE FOR IV PUSH (FOR BLOOD PRESSURE SUPPORT)
PREFILLED_SYRINGE | INTRAVENOUS | Status: DC | PRN
  Administered 2020-06-24: 80 ug via INTRAVENOUS

## 2020-06-24 MED ORDER — METOPROLOL TARTRATE 5 MG/5ML IV SOLN
2.0000 mg | INTRAVENOUS | Status: DC | PRN
  Filled 2020-06-24: qty 5

## 2020-06-24 MED ORDER — FENTANYL CITRATE (PF) 100 MCG/2ML IJ SOLN
25.0000 ug | INTRAMUSCULAR | Status: DC | PRN
  Administered 2020-06-24 (×2): 50 ug via INTRAVENOUS

## 2020-06-24 MED ORDER — PROPOFOL 10 MG/ML IV BOLUS
INTRAVENOUS | Status: AC
  Filled 2020-06-24: qty 20

## 2020-06-24 MED ORDER — VANCOMYCIN HCL 1000 MG/200ML IV SOLN
1000.0000 mg | Freq: Two times a day (BID) | INTRAVENOUS | Status: AC
  Administered 2020-06-24 – 2020-06-25 (×2): 1000 mg via INTRAVENOUS
  Filled 2020-06-24 (×2): qty 200

## 2020-06-24 MED ORDER — LACTATED RINGERS IV SOLN: INTRAVENOUS | Status: DC

## 2020-06-24 MED ORDER — ACETAMINOPHEN 325 MG PO TABS
325.0000 mg | ORAL_TABLET | ORAL | Status: DC | PRN
  Administered 2020-06-28: 650 mg via ORAL
  Filled 2020-06-24: qty 2

## 2020-06-24 MED ORDER — CHLORHEXIDINE GLUCONATE 0.12 % MT SOLN
15.0000 mL | Freq: Once | OROMUCOSAL | Status: AC
  Administered 2020-06-24: 15 mL via OROMUCOSAL
  Filled 2020-06-24: qty 15

## 2020-06-24 MED ORDER — BISACODYL 10 MG RE SUPP
10.0000 mg | Freq: Every day | RECTAL | Status: DC | PRN
Start: 2020-06-24 — End: 2020-06-29

## 2020-06-24 MED ORDER — ACETAMINOPHEN 325 MG RE SUPP
325.0000 mg | RECTAL | Status: DC | PRN
  Filled 2020-06-24: qty 2

## 2020-06-24 MED ORDER — FENTANYL CITRATE (PF) 100 MCG/2ML IJ SOLN
INTRAMUSCULAR | Status: AC
  Filled 2020-06-24: qty 2

## 2020-06-24 MED ORDER — ORAL CARE MOUTH RINSE: 15.0000 mL | Freq: Once | OROMUCOSAL | Status: AC

## 2020-06-24 MED ORDER — SODIUM CHLORIDE 0.9% IV SOLUTION: Freq: Once | INTRAVENOUS | Status: DC

## 2020-06-24 MED ORDER — PHENYLEPHRINE HCL-NACL 10-0.9 MG/250ML-% IV SOLN
INTRAVENOUS | Status: DC | PRN
  Administered 2020-06-24: 100 ug/min via INTRAVENOUS

## 2020-06-24 MED ORDER — POLYETHYLENE GLYCOL 3350 17 G PO PACK: 17.0000 g | PACK | Freq: Every day | ORAL | Status: DC | PRN

## 2020-06-24 MED ORDER — PROPOFOL 10 MG/ML IV BOLUS
INTRAVENOUS | Status: DC | PRN
  Administered 2020-06-24: 80 mg via INTRAVENOUS

## 2020-06-24 MED ORDER — ONDANSETRON HCL 4 MG/2ML IJ SOLN
INTRAMUSCULAR | Status: DC | PRN
  Administered 2020-06-24: 4 mg via INTRAVENOUS

## 2020-06-24 MED ORDER — HEPARIN SODIUM (PORCINE) 5000 UNIT/ML IJ SOLN
5000.0000 [IU] | Freq: Three times a day (TID) | INTRAMUSCULAR | Status: DC
  Administered 2020-06-25 – 2020-06-28 (×10): 5000 [IU] via SUBCUTANEOUS
  Filled 2020-06-24 (×10): qty 1

## 2020-06-24 MED ORDER — MIDAZOLAM HCL 2 MG/2ML IJ SOLN
INTRAMUSCULAR | Status: AC
  Filled 2020-06-24: qty 2

## 2020-06-24 MED ORDER — ROCURONIUM BROMIDE 10 MG/ML (PF) SYRINGE
PREFILLED_SYRINGE | INTRAVENOUS | Status: AC
  Filled 2020-06-24: qty 20

## 2020-06-24 MED ORDER — LABETALOL HCL 5 MG/ML IV SOLN
10.0000 mg | INTRAVENOUS | Status: AC | PRN
  Administered 2020-06-24 (×4): 10 mg via INTRAVENOUS

## 2020-06-24 MED ORDER — PROTAMINE SULFATE 10 MG/ML IV SOLN
INTRAVENOUS | Status: DC | PRN
  Administered 2020-06-24: 10 mg via INTRAVENOUS
  Administered 2020-06-24 (×2): 20 mg via INTRAVENOUS

## 2020-06-24 MED ORDER — HEPARIN SODIUM (PORCINE) 1000 UNIT/ML IJ SOLN
INTRAMUSCULAR | Status: DC | PRN
  Administered 2020-06-24: 3000 [IU] via INTRAVENOUS
  Administered 2020-06-24: 8000 [IU] via INTRAVENOUS

## 2020-06-24 MED ORDER — PHENOL 1.4 % MT LIQD: 1.0000 | OROMUCOSAL | Status: DC | PRN

## 2020-06-24 MED ORDER — SODIUM CHLORIDE 0.9 % IV SOLN: INTRAVENOUS | Status: DC

## 2020-06-24 MED ORDER — LIDOCAINE 2% (20 MG/ML) 5 ML SYRINGE
INTRAMUSCULAR | Status: DC | PRN
  Administered 2020-06-24: 80 mg via INTRAVENOUS

## 2020-06-24 MED ORDER — ACETAMINOPHEN 500 MG PO TABS
1000.0000 mg | ORAL_TABLET | Freq: Once | ORAL | Status: AC
  Administered 2020-06-24: 1000 mg via ORAL
  Filled 2020-06-24: qty 2

## 2020-06-24 MED ORDER — PROTAMINE SULFATE 10 MG/ML IV SOLN
INTRAVENOUS | Status: AC
  Filled 2020-06-24: qty 5

## 2020-06-24 MED ORDER — DEXTROSE 50 % IV SOLN
INTRAVENOUS | Status: DC | PRN
  Administered 2020-06-24: 12.5 g via INTRAVENOUS

## 2020-06-24 MED ORDER — INSULIN ASPART 100 UNIT/ML ~~LOC~~ SOLN
SUBCUTANEOUS | Status: DC | PRN
  Administered 2020-06-24: 10 [IU] via INTRAVENOUS

## 2020-06-24 MED ORDER — VANCOMYCIN HCL IN DEXTROSE 1-5 GM/200ML-% IV SOLN
1000.0000 mg | INTRAVENOUS | Status: DC
  Filled 2020-06-24: qty 200

## 2020-06-24 MED ORDER — ONDANSETRON HCL 4 MG/2ML IJ SOLN: 4.0000 mg | Freq: Four times a day (QID) | INTRAMUSCULAR | Status: DC | PRN

## 2020-06-24 MED ORDER — PANTOPRAZOLE SODIUM 40 MG IV SOLR
40.0000 mg | Freq: Every day | INTRAVENOUS | Status: DC
  Administered 2020-06-24 – 2020-06-26 (×3): 40 mg via INTRAVENOUS
  Filled 2020-06-24 (×3): qty 40

## 2020-06-24 SURGICAL SUPPLY — 50 items
CANISTER SUCT 3000ML PPV (MISCELLANEOUS) ×4 IMPLANT
CANNULA VESSEL 3MM 2 BLNT TIP (CANNULA) ×8 IMPLANT
CATH EMB 4FR 80CM (CATHETERS) ×4 IMPLANT
CLIP LIGATING EXTRA MED SLVR (CLIP) ×4 IMPLANT
CLIP LIGATING EXTRA SM BLUE (MISCELLANEOUS) ×4 IMPLANT
COVER BACK TABLE 60X90IN (DRAPES) ×4 IMPLANT
DERMABOND ADVANCED (GAUZE/BANDAGES/DRESSINGS) ×4
DERMABOND ADVANCED .7 DNX12 (GAUZE/BANDAGES/DRESSINGS) ×4 IMPLANT
DRAPE CV SPLIT W-CLR ANES SCRN (DRAPES) IMPLANT
DRAPE PERI GROIN 82X75IN TIB (DRAPES) IMPLANT
ELECT BLADE 4.0 EZ CLEAN MEGAD (MISCELLANEOUS)
ELECT REM PT RETURN 9FT ADLT (ELECTROSURGICAL) ×4
ELECTRODE BLDE 4.0 EZ CLN MEGD (MISCELLANEOUS) IMPLANT
ELECTRODE REM PT RTRN 9FT ADLT (ELECTROSURGICAL) ×2 IMPLANT
FELT TEFLON 1X6 (MISCELLANEOUS) ×4 IMPLANT
GLOVE SS BIOGEL STRL SZ 7.5 (GLOVE) ×2 IMPLANT
GLOVE SUPERSENSE BIOGEL SZ 7.5 (GLOVE) ×2
GOWN STRL REUS W/ TWL LRG LVL3 (GOWN DISPOSABLE) ×6 IMPLANT
GOWN STRL REUS W/TWL LRG LVL3 (GOWN DISPOSABLE) ×6
GRAFT HEMASHIELD 14X8MM (Vascular Products) ×4 IMPLANT
INSERT FOGARTY 61MM (MISCELLANEOUS) ×4 IMPLANT
INSERT FOGARTY SM (MISCELLANEOUS) ×8 IMPLANT
KIT BASIN OR (CUSTOM PROCEDURE TRAY) ×4 IMPLANT
KIT TURNOVER KIT B (KITS) ×4 IMPLANT
LOOP VESSEL MINI RED (MISCELLANEOUS) ×16 IMPLANT
NS IRRIG 1000ML POUR BTL (IV SOLUTION) ×8 IMPLANT
PACK AORTA (CUSTOM PROCEDURE TRAY) ×4 IMPLANT
PAD ARMBOARD 7.5X6 YLW CONV (MISCELLANEOUS) ×8 IMPLANT
PATCH HEMASHIELD 8X150 (Vascular Products) ×4 IMPLANT
RETAINER VISCERA MED (MISCELLANEOUS) ×4 IMPLANT
SPONGE LAP 18X18 RF (DISPOSABLE) IMPLANT
SUT ETHIBOND 5 LR DA (SUTURE) IMPLANT
SUT PDS AB 1 TP1 54 (SUTURE) ×8 IMPLANT
SUT PROLENE 3 0 SH1 36 (SUTURE) ×8 IMPLANT
SUT PROLENE 5 0 C 1 24 (SUTURE) ×8 IMPLANT
SUT PROLENE 5 0 C 1 36 (SUTURE) IMPLANT
SUT PROLENE 6 0 CC (SUTURE) ×20 IMPLANT
SUT SILK 2 0 (SUTURE) ×2
SUT SILK 2 0 SH CR/8 (SUTURE) ×4 IMPLANT
SUT SILK 2-0 18XBRD TIE 12 (SUTURE) ×2 IMPLANT
SUT SILK 3 0 (SUTURE) ×2
SUT SILK 3-0 18XBRD TIE 12 (SUTURE) ×2 IMPLANT
SUT VIC AB 2-0 CT1 36 (SUTURE) ×12 IMPLANT
SUT VIC AB 3-0 SH 27 (SUTURE) ×8
SUT VIC AB 3-0 SH 27X BRD (SUTURE) ×8 IMPLANT
SUT VIC AB 4-0 PS2 27 (SUTURE) ×8 IMPLANT
TOWEL GREEN STERILE (TOWEL DISPOSABLE) ×4 IMPLANT
TOWEL SURG RFD BLUE STRL DISP (DISPOSABLE) ×8 IMPLANT
TRAY FOLEY MTR SLVR 16FR STAT (SET/KITS/TRAYS/PACK) ×4 IMPLANT
WATER STERILE IRR 1000ML POUR (IV SOLUTION) ×8 IMPLANT

## 2020-06-24 NOTE — Anesthesia Procedure Notes (Addendum)
Procedure Name: Intubation Date/Time: 06/24/2020 8:20 AM Performed by: Lanell Matar, CRNA Pre-anesthesia Checklist: Patient identified, Emergency Drugs available, Suction available and Patient being monitored Patient Re-evaluated:Patient Re-evaluated prior to induction Oxygen Delivery Method: Circle system utilized Preoxygenation: Pre-oxygenation with 100% oxygen Induction Type: IV induction Ventilation: Mask ventilation without difficulty and Oral airway inserted - appropriate to patient size Laryngoscope Size: Glidescope and 3 Grade View: Grade I Tube type: Oral Tube size: 8.0 mm Number of attempts: 2 Airway Equipment and Method: Stylet Placement Confirmation: ETT inserted through vocal cords under direct vision,  positive ETCO2,  CO2 detector and breath sounds checked- equal and bilateral Secured at: 23 cm Tube secured with: Tape Dental Injury: Teeth and Oropharynx as per pre-operative assessment  Comments: First attempt with Miller 2 blade, light insufficient with grade 3 view, proceeded with Glidescope, small glottic opening, grade 1 view

## 2020-06-24 NOTE — Progress Notes (Signed)
  Day of Surgery Note    Subjective:  Resting in recovery   Vitals:   06/24/20 1415 06/24/20 1417  BP:  (!) 151/83  Pulse: 88 87  Resp: 18 19  Temp:    SpO2: 98% 98%    Incisions:   Midline with some mild bloody drainage distally; the left groin is clean without hematoma; the right groin is clean with mild to moderate hematoma.   Extremities:  Monophasic doppler signals bilateral DP/PT Cardiac:  regular Lungs:  Non labored Abdomen:  Mildly distended.    Assessment/Plan:  This is a 65 y.o. male who is s/p  Aortobifemoral bypass grafting.   -pt with monophasic doppler signals bilateral DP/PT  -he does have a mild to moderate hematoma right groin but this is soft.  Will continue to monitor for now.   Will keep in PACU until Dr. Arbie Cookey can evaluate after he is finished in the OR.     Doreatha Massed, PA-C 06/24/2020 2:26 PM [phone]

## 2020-06-24 NOTE — Progress Notes (Signed)
Dr. Edilia Bo Came by to assess patients hematoma at this time. Will continue to monitor per MD. Will address in the morning.

## 2020-06-24 NOTE — Op Note (Signed)
OPERATIVE REPORT  DATE OF SURGERY: 06/24/2020  PATIENT: Ronnie Larsen., 65 y.o. male MRN: [id number]  DOB: Jul 07, 1955  PRE-OPERATIVE DIAGNOSIS: Severe arterial insufficiency bilateral lower extremities related to multilevel occlusive disease  POST-OPERATIVE DIAGNOSIS:  Same with subacute occlusion of right common femoral artery  PROCEDURE: Aortobifemoral bypass with 14 x 8 Hemashield graft, extensive endarterectomy from the external iliac artery down onto the superficial femoral artery and profunda femoris arteries on the right with Dacron patch angioplasty  SURGEON:  Gretta Began, M.D.  PHYSICIAN ASSISTANT: Dr. Leonette Most fields, Darlin Coco, PA-C  The assistant was needed for exposure and to expedite the case  ANESTHESIA: General  EBL: per anesthesia record  Total I/O In: 4100 [I.V.:2550; Blood:300; IV Piggyback:1250] Out: 2075 [Urine:1075; Blood:1000]  BLOOD ADMINISTERED: none  DRAINS: none  SPECIMEN: none  COUNTS CORRECT:  YES  PATIENT DISPOSITION:  PACU - hemodynamically stable  PROCEDURE DETAILS: Patient was taken operating placed supine position where the area of the abdomen both groins prepped draped you sterile fashion.  Incision was made over the common femoral arteries bilaterally and carried down to isolate the common superficial femoral and profunda femoris arteries.  The patient had extensive calcified disease in the arteries bilaterally.  On the left patient had a normal location of the profundus femoris artery.  On the right the patient had a very low takeoff of the profundus femoris artery.  His arteries were extremely small.  He had no pulses bilaterally.  Next incision was made from the level of the xiphoid to below the umbilicus and carried down through the midline fascia with electrocautery.  The peritoneum was entered.  The abdomen was floored with no abnormality noted.  The Omni-Tract retractor was used for exposure.  The transverse colon and omentum  were reflected superiorly and the small bowel was reflected to the right.  The duodenum was mobilized off the aorta.  The aorta was encircled just below the level of the renal arteries.  Dissection was continued down to the aortic bifurcation.  The inferior mesenteric artery was relatively small but was patent.  Tunnels were created created from the level of the aortic bifurcation to the groin bilaterally taking care to pass behind the level of the ureters.  Umbilical tapes were passed through these tunnels for future placement of the graft.  The patient was given 25 g of mannitol and 8000 units of intravenous heparin.  Affect circulation time the aorta was occluded with a Harken clamp below the level the renal arteries and the aorta was occluded with a DeBakey clamp above the IMA takeoff.  The aorta was transected below the proximal clamp.  There was extensive plaque present and this was endarterectomized.  A segment of aorta was first excised.  The distal aorta was oversewn with several layers of 3-0 Prolene.  The distal aortic clamp was removed with good hemostasis.  A 14 x 8 Hemashield graft was brought onto the field.  The majority of the main body was divided.  Using a felt strip reinforcement, the the aorta graft was sewn into into the aorta with a running 3-0 Prolene suture.  This anastomosis was tested and found to be adequate.  The right and left limbs of the graft were then brought to the appropriate groins.  The right external iliac artery was occluded and the profunda and superficial femoral arteries were occluded with Vesseloops.  The common femoral artery was open.  There was subacute thrombus with complete occlusion of the  common femoral artery.  This was not present on the CT scan from 05/08/2020.  The thrombus was removed directly.  This was extended up to the level under the inguinal ligament with a large collateral.  There was an endarterectomy from this site down onto the profundus femoris  artery.  The suprasternal artery was chronically occluded.  The long Dacron Hemashield patch was brought onto the field and was a sewn as a patch angioplasty over the extensive endarterectomy from the suprasternal artery up to just below the external iliac.  The right limb of the graft was then sewn into side to the junction of the native artery proximally with the heel of the right limb the toe was continued down and was sewn to the Dacron patch.  This anastomosis was tested and found to be with adequate.  Next the left external iliac and deep and superficial femoral arteries were occluded.  The artery was opened with an 11 blade Celestia Potts scissors.  There was a wide patency into the deep femoral artery.  The supra femoral artery is chronically occluded.  The left limb of the graft was cut to appropriate dimension was sewn end-to-side to the artery with a running 5-0 Prolene suture.  After the usual flushing maneuvers anastomosis were completed and the clamps were removed.  The patient was given protamine to reverse the heparin.  Wounds irrigated with saline.  The abdominal wound was closed with 2-0 Vicryl to close the retroperitoneal space to exclude the duodenum from the graft.  The small bowel was run in its entirety and found it without injury.  Transverse colon omentum were placed over this.  The midline fascia was closed with a #1 PDS suture beginning proximally distally and tying in the middle.  The wound was closed with a 3-0 subcuticular Vicryl stitch.  The groin wounds were irrigated and hemostasis obtained with cautery.  The wounds were closed with 2-0 Vicryl in several layers and subcutaneous tissue and the skin was closed with 3-0 subcuticular Vicryl stitch.  Sterile dressing was applied the patient.  The patient had good Doppler flow to his foot and was transferred to recovery in stable condition   Larina Earthly, M.D., [facility] 06/24/2020 5:35 PM  Note: Portions of this report may have been  transcribed using voice recognition software.  Every effort has been made to ensure accuracy; however, inadvertent computerized transcription errors may still be present.

## 2020-06-24 NOTE — Anesthesia Procedure Notes (Addendum)
Arterial Line Insertion Start/End2/12/2020 6:30 AM, 06/24/2020 6:45 AM Performed by: Lanell Matar, CRNA, CRNA  Patient location: Pre-op. Preanesthetic checklist: patient identified, IV checked, site marked, risks and benefits discussed, surgical consent, monitors and equipment checked, pre-op evaluation and timeout performed Lidocaine 1% used for infiltration and patient sedated Right, radial was placed Catheter size: 20 G Hand hygiene performed , maximum sterile barriers used  and Seldinger technique used Allen's test indicative of satisfactory collateral circulation Attempts: 1 Procedure performed without using ultrasound guided technique. Following insertion, dressing applied and Biopatch. Post procedure assessment: normal  Patient tolerated the procedure well with no immediate complications.

## 2020-06-24 NOTE — Anesthesia Procedure Notes (Addendum)
Central Venous Catheter Insertion Performed by: Cecile Hearing, MD, anesthesiologist Start/End2/12/2020 7:05 AM, 06/24/2020 7:15 AM Patient location: Pre-op. Preanesthetic checklist: patient identified, IV checked, site marked, risks and benefits discussed, surgical consent, monitors and equipment checked, pre-op evaluation, timeout performed and anesthesia consent Position: Trendelenburg Lidocaine 1% used for infiltration and patient sedated Hand hygiene performed , maximum sterile barriers used  and Seldinger technique used Catheter size: 8.5 Fr Central line was placed.Sheath introducer Procedure performed using ultrasound guided technique. Ultrasound Notes:anatomy identified, needle tip was noted to be adjacent to the nerve/plexus identified, no ultrasound evidence of intravascular and/or intraneural injection and image(s) printed for medical record Attempts: 1 Following insertion, line sutured, dressing applied and Biopatch. Post procedure assessment: free fluid flow, blood return through all ports and no air  Patient tolerated the procedure well with no immediate complications.

## 2020-06-24 NOTE — Transfer of Care (Signed)
Immediate Anesthesia Transfer of Care Note  Patient: Ronnie Larsen.  Procedure(s) Performed: AORTA BIFEMORAL BYPASS GRAFT (N/A Abdomen) PATCH ANGIOPLASTY (Right Groin)  Patient Location: PACU  Anesthesia Type:General  Level of Consciousness: awake, alert , oriented and drowsy  Airway & Oxygen Therapy: Patient Spontanous Breathing and Patient connected to face mask oxygen  Post-op Assessment: Report given to RN, Post -op Vital signs reviewed and stable and Patient moving all extremities X 4  Post vital signs: Reviewed and stable  Last Vitals:  Vitals Value Taken Time  BP 175/75 06/24/20 1302  Temp    Pulse 99 06/24/20 1308  Resp 23 06/24/20 1308  SpO2 100 % 06/24/20 1308  Vitals shown include unvalidated device data.  Last Pain:  Vitals:   06/24/20 0624  TempSrc:   PainSc: 0-No pain      Patients Stated Pain Goal: 5 (06/24/20 3838)  Complications: No complications documented.

## 2020-06-24 NOTE — Progress Notes (Signed)
Patient ID: Ronnie Larsen., male   DOB: 1956-03-18, 65 y.o.   MRN: [id number] Comfortable in PACU.  Good Doppler flow to his feet bilaterally.  Moderate hematoma in his extensive endarterectomy on the right groin which is stable.  None on the left.  Stable for transfer to [facility]

## 2020-06-24 NOTE — Discharge Instructions (Signed)
 [facility]  Discharge Instructions   Open Aortic Surgery  Please refer to the following instructions for your post-procedure care. Your surgeon or Physician Assistant will discuss any changes with you.  Activity  Avoid lifting more than eight pounds (a gallon of milk) until after your first post-operative visit. You are encouraged to walk as much as you can. You can slowly return to normal activities but must avoid strenuous activity and heavy lifting until your doctor tells you it's okay. Heavy lifting can hurt the incision and cause a hernia. Avoid activities such as vacuuming or swinging a golf club. It is normal to feel tired for several weeks after your surgery. Do not drive until your doctor gives the okay and you are no longer taking prescription pain medications. It is also normal to have difficulty with sleep habits, eating and bowl movements after surgery. These will go away with time.  Bathing/Showering  Shower daily after you go home. Do not soak in a bathtub, hot tub, or swim until the incision heals.  Incision Care  Shower every day. Clean your incision with mild soap and water. Pat the area dry with a clean towel. You do not need a bandage unless otherwise instructed. Do not apply any ointments or creams to your incision. You may have skin glue on your incision. Do not peel it off. It will come off on its own in about one week. If you have staples or sutures along your incision, they will be removed at your post op appointment.  If you have groin incisions, wash the groin wounds with soap and water daily and pat dry. (No tub bath-only shower)  Then put a dry gauze or washcloth in the groin to keep this area dry to help prevent wound infection.  Do this daily and as needed.  Do not use Vaseline or neosporin on your incisions.  Only use soap and water on your incisions and then protect and keep dry.  Diet  Resume your normal diet. There are no  special food restriction following this procedure. A low fat/low cholesterol diet is recommended for all patients with vascular disease. After your aortic surgery, it's normal to feel full faster than usual and to not feel as hungry as you normally would. You will probably lose weight initially following your surgery. It's best to eat small, frequent meals over the course of the day. Call the office if you find that you are unable to eat even small meals.   In order to heal from your surgery, it is CRITICAL to get adequate nutrition. Your body requires vitamins, minerals, and protein. Vegetables are the best source of vitamins and minerals. If you have pain, you may take over-the-counter pain reliever such as acetaminophen (Tylenol). If you were prescribed a stronger pain medication, please be aware these medication can cause nausea and constipation. Prevent nausea by taking the medication with a snack or meal. Avoid constipation by drinking plenty of fluids and eating foods with a high amount of fiber, such as fruits, vegetables and grains. Take 100mg of the over-the-counter stool softener Colace twice a day as needed to help with constipation. A laxative, such as Milk of Magnesia, may be recommended for you at this time. Do not take a laxative unless your surgeon or P.A. tells you it's OK.  Do not take Tylenol if you are taking stronger pain medications (such as Percocet).  Follow Up  Our office will schedule a follow up   appointment 2-3 weeks after discharge.  Please call us immediately for any of the following conditions    .     Severe or worsening pain in your legs or feet or in your abdomen back or chest. Increased pain, redness drainage (pus) from your incision site. Increased abdominal pain, bloating, nausea, vomiting, or persistent diarrhea. Fever of 101 degrees or higher. Swelling in your leg (s).  Reduce your risk of vascular disease  Stop smoking. If you would like help, call  [facility] at 1-800-QUIT-NOW (1-800-784-8669) or [place] at 336-586-4000. Manage your cholesterol Maintain a desired weight Control your diabetes Keep your blood pressure down  If you have any questions please call the office at [phone].   

## 2020-06-24 NOTE — Interval H&P Note (Signed)
History and Physical Interval Note:  06/24/2020 7:11 AM  Ronnie Larsen.  has presented today for surgery, with the diagnosis of aortoiliac occlusive disease.  The various methods of treatment have been discussed with the patient and family. After consideration of risks, benefits and other options for treatment, the patient has consented to  Procedure(s): AORTA BIFEMORAL BYPASS GRAFTING (Bilateral) as a surgical intervention.  The patient's history has been reviewed, patient examined, no change in status, stable for surgery.  I have reviewed the patient's chart and labs.  Questions were answered to the patient's satisfaction.     Gretta Began

## 2020-06-24 NOTE — Anesthesia Postprocedure Evaluation (Signed)
Anesthesia Post Note  Patient: Ronnie Larsen.  Procedure(s) Performed: AORTA BIFEMORAL BYPASS GRAFT (N/A Abdomen) PATCH ANGIOPLASTY (Right Groin)     Patient location during evaluation: PACU Anesthesia Type: General Level of consciousness: awake and alert Pain management: pain level controlled Vital Signs Assessment: post-procedure vital signs reviewed and stable Respiratory status: spontaneous breathing, nonlabored ventilation, respiratory function stable and patient connected to nasal cannula oxygen Cardiovascular status: blood pressure returned to baseline and stable Postop Assessment: no apparent nausea or vomiting Anesthetic complications: no   No complications documented.  Last Vitals:  Vitals:   06/24/20 1618 06/24/20 1630  BP: (!) 153/79   Pulse: 80 80  Resp: 15 18  Temp:    SpO2: 100% 100%    Last Pain:  Vitals:   06/24/20 1440  TempSrc:   PainSc: 10-Worst pain ever                 Cecile Hearing

## 2020-06-24 NOTE — Progress Notes (Signed)
Hematoma to right going has extended past marked area. Vitals are stable. Will remark and continue to monitor.

## 2020-06-24 NOTE — OR Nursing (Signed)
Upon foley catheter insertion it was noted that the patient has hypospadias. Urethral catheter insertion was completed without any difficulty. Foreskin returned to normal position after foley insertion.

## 2020-06-25 ENCOUNTER — Encounter (HOSPITAL_COMMUNITY)
Admission: RE | Disposition: A | Discharge status: Home / Self Care | Payer: Self-pay | Source: Home / Self Care | Attending: Vascular Surgery

## 2020-06-25 ENCOUNTER — Encounter (HOSPITAL_COMMUNITY): Payer: Self-pay | Admitting: Vascular Surgery

## 2020-06-25 ENCOUNTER — Inpatient Hospital Stay (HOSPITAL_COMMUNITY): Payer: BC Managed Care – PPO | Admitting: Anesthesiology

## 2020-06-25 ENCOUNTER — Inpatient Hospital Stay (HOSPITAL_COMMUNITY): Payer: BC Managed Care – PPO

## 2020-06-25 DIAGNOSIS — I97638 Postprocedural hematoma of a circulatory system organ or structure following other circulatory system procedure: Secondary | ICD-10-CM

## 2020-06-25 HISTORY — PX: HEMATOMA EVACUATION: SHX5118

## 2020-06-25 LAB — POCT I-STAT 7, (LYTES, BLD GAS, ICA,H+H)
Acid-base deficit: 1 mmol/L (ref 0.0–2.0)
Acid-base deficit: 4 mmol/L — ABNORMAL HIGH (ref 0.0–2.0)
Acid-base deficit: 5 mmol/L — ABNORMAL HIGH (ref 0.0–2.0)
Acid-base deficit: 3 mmol/L — ABNORMAL HIGH (ref 0.0–2.0)
Bicarbonate: 26.1 mmol/L (ref 20.0–28.0)
Bicarbonate: 23.3 mmol/L (ref 20.0–28.0)
Bicarbonate: 21.3 mmol/L (ref 20.0–28.0)
Bicarbonate: 23.1 mmol/L (ref 20.0–28.0)
Calcium, Ion: 1.23 mmol/L (ref 1.15–1.40)
Calcium, Ion: 1.11 mmol/L — ABNORMAL LOW (ref 1.15–1.40)
Calcium, Ion: 1.11 mmol/L — ABNORMAL LOW (ref 1.15–1.40)
Calcium, Ion: 1.1 mmol/L — ABNORMAL LOW (ref 1.15–1.40)
HCT: 48 % (ref 39.0–52.0)
HCT: 46 % (ref 39.0–52.0)
HCT: 41 % (ref 39.0–52.0)
HCT: 38 % — ABNORMAL LOW (ref 39.0–52.0)
Hemoglobin: 16.3 g/dL (ref 13.0–17.0)
Hemoglobin: 15.6 g/dL (ref 13.0–17.0)
Hemoglobin: 13.9 g/dL (ref 13.0–17.0)
Hemoglobin: 12.9 g/dL — ABNORMAL LOW (ref 13.0–17.0)
O2 Saturation: 100 %
O2 Saturation: 94 %
O2 Saturation: 100 %
O2 Saturation: 100 %
Patient temperature: 36.2
Patient temperature: 37
Patient temperature: 37.4
Potassium: 4.8 mmol/L (ref 3.5–5.1)
Potassium: 6 mmol/L — ABNORMAL HIGH (ref 3.5–5.1)
Potassium: 4.9 mmol/L (ref 3.5–5.1)
Potassium: 4.3 mmol/L (ref 3.5–5.1)
Sodium: 140 mmol/L (ref 135–145)
Sodium: 135 mmol/L (ref 135–145)
Sodium: 137 mmol/L (ref 135–145)
Sodium: 139 mmol/L (ref 135–145)
TCO2: 28 mmol/L (ref 22–32)
TCO2: 25 mmol/L (ref 22–32)
TCO2: 22 mmol/L (ref 22–32)
TCO2: 24 mmol/L (ref 22–32)
pCO2 arterial: 51.6 mmHg — ABNORMAL HIGH (ref 32.0–48.0)
pCO2 arterial: 51.3 mmHg — ABNORMAL HIGH (ref 32.0–48.0)
pCO2 arterial: 41.5 mmHg (ref 32.0–48.0)
pCO2 arterial: 43.8 mmHg (ref 32.0–48.0)
pH, Arterial: 7.308 — ABNORMAL LOW (ref 7.350–7.450)
pH, Arterial: 7.266 — ABNORMAL LOW (ref 7.350–7.450)
pH, Arterial: 7.32 — ABNORMAL LOW (ref 7.350–7.450)
pH, Arterial: 7.331 — ABNORMAL LOW (ref 7.350–7.450)
pO2, Arterial: 246 mmHg — ABNORMAL HIGH (ref 83.0–108.0)
pO2, Arterial: 80 mmHg — ABNORMAL LOW (ref 83.0–108.0)
pO2, Arterial: 253 mmHg — ABNORMAL HIGH (ref 83.0–108.0)
pO2, Arterial: 253 mmHg — ABNORMAL HIGH (ref 83.0–108.0)

## 2020-06-25 LAB — COMPREHENSIVE METABOLIC PANEL
ALT: 61 U/L — ABNORMAL HIGH (ref 0–44)
AST: 57 U/L — ABNORMAL HIGH (ref 15–41)
Albumin: 3.1 g/dL — ABNORMAL LOW (ref 3.5–5.0)
Alkaline Phosphatase: 36 U/L — ABNORMAL LOW (ref 38–126)
Anion gap: 11 (ref 5–15)
BUN: 20 mg/dL (ref 8–23)
CO2: 20 mmol/L — ABNORMAL LOW (ref 22–32)
Calcium: 7.5 mg/dL — ABNORMAL LOW (ref 8.9–10.3)
Chloride: 106 mmol/L (ref 98–111)
Creatinine, Ser: 1.62 mg/dL — ABNORMAL HIGH (ref 0.61–1.24)
GFR, Estimated: 48 mL/min — ABNORMAL LOW (ref >60)
Glucose, Bld: 139 mg/dL — ABNORMAL HIGH (ref 70–99)
Potassium: 4.6 mmol/L (ref 3.5–5.1)
Sodium: 137 mmol/L (ref 135–145)
Total Bilirubin: 1.3 mg/dL — ABNORMAL HIGH (ref 0.3–1.2)
Total Protein: 4.8 g/dL — ABNORMAL LOW (ref 6.5–8.1)

## 2020-06-25 LAB — AMYLASE: Amylase: 45 U/L (ref 28–100)

## 2020-06-25 LAB — CBC
HCT: 37 % — ABNORMAL LOW (ref 39.0–52.0)
Hemoglobin: 12.1 g/dL — ABNORMAL LOW (ref 13.0–17.0)
MCH: 30.7 pg (ref 26.0–34.0)
MCHC: 32.7 g/dL (ref 30.0–36.0)
MCV: 93.9 fL (ref 80.0–100.0)
Platelets: 183 10*3/uL (ref 150–400)
RBC: 3.94 MIL/uL — ABNORMAL LOW (ref 4.22–5.81)
RDW: 12.4 % (ref 11.5–15.5)
WBC: 16.7 10*3/uL — ABNORMAL HIGH (ref 4.0–10.5)
nRBC: 0 % (ref 0.0–0.2)

## 2020-06-25 LAB — MAGNESIUM: Magnesium: 1.5 mg/dL — ABNORMAL LOW (ref 1.7–2.4)

## 2020-06-25 SURGERY — EVACUATION HEMATOMA
Anesthesia: General | Site: Groin | Laterality: Right

## 2020-06-25 MED ORDER — PHENYLEPHRINE HCL (PRESSORS) 10 MG/ML IV SOLN
INTRAVENOUS | Status: DC | PRN
  Administered 2020-06-25: 200 ug via INTRAVENOUS

## 2020-06-25 MED ORDER — FENTANYL CITRATE (PF) 100 MCG/2ML IJ SOLN
INTRAMUSCULAR | Status: AC
  Filled 2020-06-25: qty 2

## 2020-06-25 MED ORDER — MIDAZOLAM HCL 2 MG/2ML IJ SOLN
INTRAMUSCULAR | Status: AC
  Filled 2020-06-25: qty 2

## 2020-06-25 MED ORDER — MIDAZOLAM HCL 5 MG/5ML IJ SOLN
INTRAMUSCULAR | Status: DC | PRN
  Administered 2020-06-25: 2 mg via INTRAVENOUS

## 2020-06-25 MED ORDER — ONDANSETRON HCL 4 MG/2ML IJ SOLN
INTRAMUSCULAR | Status: DC | PRN
  Administered 2020-06-25: 4 mg via INTRAVENOUS

## 2020-06-25 MED ORDER — PROPOFOL 10 MG/ML IV BOLUS
INTRAVENOUS | Status: AC
  Filled 2020-06-25: qty 20

## 2020-06-25 MED ORDER — SUGAMMADEX SODIUM 200 MG/2ML IV SOLN
INTRAVENOUS | Status: DC | PRN
  Administered 2020-06-25: 400 mg via INTRAVENOUS

## 2020-06-25 MED ORDER — CHLORHEXIDINE GLUCONATE 0.12 % MT SOLN
OROMUCOSAL | Status: AC
  Filled 2020-06-25: qty 15

## 2020-06-25 MED ORDER — MAGNESIUM SULFATE 2 GM/50ML IV SOLN: 2.0000 g | Freq: Once | INTRAVENOUS | Status: DC

## 2020-06-25 MED ORDER — FENTANYL CITRATE (PF) 100 MCG/2ML IJ SOLN
25.0000 ug | INTRAMUSCULAR | Status: DC | PRN
  Administered 2020-06-25: 50 ug via INTRAVENOUS

## 2020-06-25 MED ORDER — SODIUM CHLORIDE 0.9 % IR SOLN
Status: DC | PRN
  Administered 2020-06-25: 1000 mL

## 2020-06-25 MED ORDER — OXYCODONE HCL 5 MG/5ML PO SOLN: 5.0000 mg | Freq: Once | ORAL | Status: DC | PRN

## 2020-06-25 MED ORDER — LACTATED RINGERS IV SOLN: INTRAVENOUS | Status: DC | PRN

## 2020-06-25 MED ORDER — ONDANSETRON HCL 4 MG/2ML IJ SOLN: 4.0000 mg | Freq: Once | INTRAMUSCULAR | Status: DC | PRN

## 2020-06-25 MED ORDER — LIDOCAINE 2% (20 MG/ML) 5 ML SYRINGE
INTRAMUSCULAR | Status: DC | PRN
  Administered 2020-06-25: 30 mg via INTRAVENOUS

## 2020-06-25 MED ORDER — ALBUMIN HUMAN 5 % IV SOLN: INTRAVENOUS | Status: DC | PRN

## 2020-06-25 MED ORDER — PROPOFOL 10 MG/ML IV BOLUS
INTRAVENOUS | Status: DC | PRN
  Administered 2020-06-25: 150 mg via INTRAVENOUS

## 2020-06-25 MED ORDER — FENTANYL CITRATE (PF) 100 MCG/2ML IJ SOLN
INTRAMUSCULAR | Status: DC | PRN
  Administered 2020-06-25 (×2): 100 ug via INTRAVENOUS

## 2020-06-25 MED ORDER — ROCURONIUM BROMIDE 10 MG/ML (PF) SYRINGE
PREFILLED_SYRINGE | INTRAVENOUS | Status: DC | PRN
  Administered 2020-06-25: 50 mg via INTRAVENOUS

## 2020-06-25 MED ORDER — FENTANYL CITRATE (PF) 250 MCG/5ML IJ SOLN
INTRAMUSCULAR | Status: AC
  Filled 2020-06-25: qty 5

## 2020-06-25 MED ORDER — VANCOMYCIN HCL 1000 MG/200ML IV SOLN
1000.0000 mg | Freq: Once | INTRAVENOUS | Status: DC
  Filled 2020-06-25 (×4): qty 200

## 2020-06-25 MED ORDER — OXYCODONE HCL 5 MG PO TABS: 5.0000 mg | ORAL_TABLET | Freq: Once | ORAL | Status: DC | PRN

## 2020-06-25 MED ORDER — VANCOMYCIN HCL 1000 MG/200ML IV SOLN
1000.0000 mg | Freq: Once | INTRAVENOUS | Status: AC
  Administered 2020-06-25: 1000 mg via INTRAVENOUS
  Filled 2020-06-25 (×2): qty 200

## 2020-06-25 MED FILL — Sodium Chloride IV Soln 0.9%: INTRAVENOUS | Qty: 1000 | Status: AC

## 2020-06-25 MED FILL — Heparin Sodium (Porcine) Inj 1000 Unit/ML: INTRAMUSCULAR | Qty: 30 | Status: AC

## 2020-06-25 SURGICAL SUPPLY — 46 items
BANDAGE ESMARK 6X9 LF (GAUZE/BANDAGES/DRESSINGS) IMPLANT
BNDG ESMARK 6X9 LF (GAUZE/BANDAGES/DRESSINGS)
CANISTER SUCT 3000ML PPV (MISCELLANEOUS) ×2 IMPLANT
CLIP LIGATING EXTRA MED SLVR (CLIP) IMPLANT
CLIP LIGATING EXTRA SM BLUE (MISCELLANEOUS) IMPLANT
COVER WAND RF STERILE (DRAPES) ×2 IMPLANT
CUFF TOURN SGL QUICK 18X4 (TOURNIQUET CUFF) IMPLANT
CUFF TOURN SGL QUICK 24 (TOURNIQUET CUFF)
CUFF TOURN SGL QUICK 34 (TOURNIQUET CUFF)
CUFF TOURN SGL QUICK 42 (TOURNIQUET CUFF) IMPLANT
CUFF TRNQT CYL 24X4X16.5-23 (TOURNIQUET CUFF) IMPLANT
CUFF TRNQT CYL 34X4.125X (TOURNIQUET CUFF) IMPLANT
DERMABOND ADVANCED (GAUZE/BANDAGES/DRESSINGS) ×1
DERMABOND ADVANCED .7 DNX12 (GAUZE/BANDAGES/DRESSINGS) ×1 IMPLANT
DRAIN SNY 10X20 3/4 PERF (WOUND CARE) IMPLANT
DRAPE X-RAY CASS 24X20 (DRAPES) IMPLANT
ELECT REM PT RETURN 9FT ADLT (ELECTROSURGICAL) ×2
ELECTRODE REM PT RTRN 9FT ADLT (ELECTROSURGICAL) ×1 IMPLANT
EVACUATOR SILICONE 100CC (DRAIN) IMPLANT
GLOVE SS BIOGEL STRL SZ 7.5 (GLOVE) ×1 IMPLANT
GLOVE SUPERSENSE BIOGEL SZ 7.5 (GLOVE) ×1
GOWN STRL REUS W/ TWL LRG LVL3 (GOWN DISPOSABLE) ×3 IMPLANT
GOWN STRL REUS W/TWL LRG LVL3 (GOWN DISPOSABLE) ×3
KIT BASIN OR (CUSTOM PROCEDURE TRAY) ×2 IMPLANT
KIT TURNOVER KIT B (KITS) ×2 IMPLANT
NS IRRIG 1000ML POUR BTL (IV SOLUTION) ×4 IMPLANT
PACK CV ACCESS (CUSTOM PROCEDURE TRAY) IMPLANT
PACK GENERAL/GYN (CUSTOM PROCEDURE TRAY) IMPLANT
PACK PERIPHERAL VASCULAR (CUSTOM PROCEDURE TRAY) IMPLANT
PACK UNIVERSAL I (CUSTOM PROCEDURE TRAY) IMPLANT
PAD ARMBOARD 7.5X6 YLW CONV (MISCELLANEOUS) ×4 IMPLANT
PADDING CAST COTTON 6X4 STRL (CAST SUPPLIES) IMPLANT
SET COLLECT BLD 21X3/4 12 (NEEDLE) IMPLANT
STAPLER VISISTAT 35W (STAPLE) IMPLANT
STOPCOCK 4 WAY LG BORE MALE ST (IV SETS) IMPLANT
SUT ETHILON 3 0 PS 1 (SUTURE) IMPLANT
SUT PROLENE 5 0 C 1 24 (SUTURE) IMPLANT
SUT PROLENE 6 0 CC (SUTURE) IMPLANT
SUT VIC AB 2-0 CTX 36 (SUTURE) IMPLANT
SUT VIC AB 3-0 SH 27 (SUTURE)
SUT VIC AB 3-0 SH 27X BRD (SUTURE) IMPLANT
TOWEL GREEN STERILE (TOWEL DISPOSABLE) ×2 IMPLANT
TRAY FOLEY MTR SLVR 16FR STAT (SET/KITS/TRAYS/PACK) IMPLANT
TUBING EXTENTION W/L.L. (IV SETS) IMPLANT
UNDERPAD 30X36 HEAVY ABSORB (UNDERPADS AND DIAPERS) ×2 IMPLANT
WATER STERILE IRR 1000ML POUR (IV SOLUTION) ×2 IMPLANT

## 2020-06-25 NOTE — Evaluation (Signed)
Physical Therapy Evaluation Patient Details Name: Ronnie Larsen. MRN: [id number] DOB: 1955-05-21 Today's Date: 06/25/2020   History of Present Illness  65 y.o. male multilevel arterial insufficiency with severe intermittent claudication.  UnderwentvAortobifemoral bypass grafting.  Clinical Impression  PTA pt living with children in single story home with 2 steps to enter. Pt reports independence with mobility without AD and independence in ADLs and iADLs. Pt is currently limited in safe mobility by increased pain and decreased endurance. Pt is min A for transfers and ambulation of 12 feet with RW. PT recommends HHPT at discharge to return to PLOF. PT will continue to follow acutely.     Follow Up Recommendations Home health PT;Supervision for mobility/OOB    Equipment Recommendations  Rolling walker with 5" wheels       Precautions / Restrictions Precautions Precautions: Fall Restrictions Weight Bearing Restrictions: No      Mobility  Bed Mobility Overal bed mobility: Needs Assistance Bed Mobility: Rolling;Sit to Sidelying Rolling: Min guard   Supine to sit: Min assist Sit to supine: Min assist Sit to sidelying: Min assist General bed mobility comments: min A for bringing LE back into bed and managing lines and leads    Transfers Overall transfer level: Needs assistance Equipment used: None (EVA) Transfers: Sit to/from [employer] Sit to Stand: Min assist Stand pivot transfers: Supervision       General transfer comment: minA for power up from low recliner  Ambulation/Gait Ambulation/Gait assistance: Min assist Gait Distance (Feet): 12 Feet Assistive device: Rolling walker (2 wheeled) (EVA walker) Gait Pattern/deviations: Step-to pattern;Decreased step length - right;Decreased step length - left;Shuffle Gait velocity: slowed Gait velocity interpretation: <1.31 ft/sec, indicative of household ambulator General Gait Details: min A for steadying  with walking, slowed, antalgic gait      Balance Overall balance assessment: Needs assistance Sitting-balance support: Feet supported Sitting balance-Leahy Scale: Good     Standing balance support: Bilateral upper extremity supported Standing balance-Leahy Scale: Poor                               Pertinent Vitals/Pain Pain Assessment: 0-10 Pain Score: 10-Worst pain ever Faces Pain Scale: Hurts whole lot Pain Location: surgical site Pain Descriptors / Indicators: Tender;Grimacing;Guarding Pain Intervention(s): Limited activity within patient's tolerance;Monitored during session;Repositioned;Patient requesting pain meds-RN notified    Home Living Family/patient expects to be discharged to:: Private residence Living Arrangements: Children Available Help at Discharge: Family;Available 24 hours/day Type of Home: House Home Access: Stairs to enter   [employer] of Steps: 2 Home Layout: One level Home Equipment: None      Prior Function Level of Independence: Independent               Hand Dominance   Dominant Hand: Right    Extremity/Trunk Assessment   Upper Extremity Assessment Upper Extremity Assessment: Defer to OT evaluation    Lower Extremity Assessment Lower Extremity Assessment: Overall WFL for tasks assessed    Cervical / Trunk Assessment Cervical / Trunk Assessment: Normal  Communication   Communication: No difficulties  Cognition Arousal/Alertness: Awake/alert Behavior During Therapy: Flat affect Overall Cognitive Status: Within Functional Limits for tasks assessed                                        General Comments General comments (skin integrity, edema,  etc.): Pt on 2 L O2 via [place] on entry, removed for mobility and able to maintain during ambulation, replaced at end of session as pt plan to try to sleep, VSS        Assessment/Plan    PT Assessment Patient needs continued PT services  PT  Problem List Decreased range of motion;Decreased activity tolerance;Decreased balance;Decreased mobility;Cardiopulmonary status limiting activity;Decreased safety awareness;Decreased knowledge of use of DME;Pain       PT Treatment Interventions DME instruction;Gait training;Functional mobility training;Therapeutic activities;Therapeutic exercise;Balance training;Cognitive remediation;Patient/family education    PT Goals (Current goals can be found in the Care Plan section)  Acute Rehab PT Goals Patient Stated Goal: just be able to move a little PT Goal Formulation: With patient/family Time For Goal Achievement: 07/09/20 Potential to Achieve Goals: Good    Frequency Min 3X/week    AM-PAC PT "6 Clicks" Mobility  Outcome Measure Help needed turning from your back to your side while in a flat bed without using bedrails?: None Help needed moving from lying on your back to sitting on the side of a flat bed without using bedrails?: A Little Help needed moving to and from a bed to a chair (including a wheelchair)?: A Little Help needed standing up from a chair using your arms (e.g., wheelchair or bedside chair)?: A Little Help needed to walk in hospital room?: A Little Help needed climbing 3-5 steps with a railing? : A Lot 6 Click Score: 18    End of Session Equipment Utilized During Treatment: Oxygen Activity Tolerance: Patient limited by pain Patient left: in bed;with call bell/phone within reach;with family/visitor present Nurse Communication: Mobility status;Patient requests pain meds PT Visit Diagnosis: Unsteadiness on feet (R26.81);Other abnormalities of gait and mobility (R26.89);Difficulty in walking, not elsewhere classified (R26.2);Pain Pain - part of body:  (abdominal incision)    Time: 1014-1040 PT Time Calculation (min) (ACUTE ONLY): 26 min   Charges:   PT Evaluation $PT Eval Moderate Complexity: 1 Mod PT Treatments $Gait Training: 8-22 mins        Ronnie Larsen B. Beverely Risen PT, DPT Acute Rehabilitation Services Pager [phone] Office [phone]   Ronnie Larsen Fleet 06/25/2020, 12:42 PM

## 2020-06-25 NOTE — Evaluation (Signed)
Occupational Therapy Evaluation Patient Details Name: Ronnie Larsen. MRN: [id number] DOB: 12-Mar-1956 Today's Date: 06/25/2020    History of Present Illness 65 y.o. male multilevel arterial insufficiency with severe intermittent claudication.  UnderwentvAortobifemoral bypass grafting.   Clinical Impression   Patient admitted with the above diagnosis and procedure.  PTA he was independent with all mobility and care.  He is not working, and his son will be home, and able to assist as needed.  Currently he is needing Min A for basic mobility and up to Mod A for lower body ADL due to abdominal pain.  It is anticipated he will do well, and should not need any OT follow up post acute.      Follow Up Recommendations  No OT follow up    Equipment Recommendations  None recommended by OT    Recommendations for Other Services       Precautions / Restrictions Precautions Precautions: Fall Restrictions Weight Bearing Restrictions: No      Mobility Bed Mobility Overal bed mobility: Needs Assistance Bed Mobility: Rolling;Supine to Sit;Sit to Supine Rolling: Min assist   Supine to sit: Min assist Sit to supine: Min assist        Transfers Overall transfer level: Needs assistance   Transfers: Sit to/from Stand;Stand Pivot Transfers Sit to Stand: Supervision Stand pivot transfers: Supervision            Balance Overall balance assessment: Needs assistance Sitting-balance support: Feet supported Sitting balance-Leahy Scale: Good     Standing balance support: Bilateral upper extremity supported Standing balance-Leahy Scale: Poor                             ADL either performed or assessed with clinical judgement   ADL Overall ADL's : Needs assistance/impaired Eating/Feeding: NPO Eating/Feeding Details (indicate cue type and reason): procedure this afternoon Grooming: Wash/dry hands;Wash/dry face;Set up;Sitting   Upper Body Bathing: Set up;Sitting    Lower Body Bathing: Moderate assistance;Sit to/from stand   Upper Body Dressing : Minimal assistance;Sitting   Lower Body Dressing: Moderate assistance;Sit to/from stand               Functional mobility during ADLs: Minimal assistance       Vision Baseline Vision/History: Wears glasses Wears Glasses: At all times Patient Visual Report: No change from baseline       Perception     Praxis      Pertinent Vitals/Pain Pain Assessment: Faces Faces Pain Scale: Hurts whole lot Pain Location: Abdominal incision Pain Descriptors / Indicators: Tender;Grimacing;Guarding Pain Intervention(s): Monitored during session     Hand Dominance Right   Extremity/Trunk Assessment Upper Extremity Assessment Upper Extremity Assessment: Overall WFL for tasks assessed   Lower Extremity Assessment Lower Extremity Assessment: Defer to PT evaluation   Cervical / Trunk Assessment Cervical / Trunk Assessment: Normal   Communication Communication Communication: No difficulties   Cognition Arousal/Alertness: Awake/alert Behavior During Therapy: Flat affect Overall Cognitive Status: Within Functional Limits for tasks assessed                                     General Comments   VVS    Exercises     Shoulder Instructions      Home Living Family/patient expects to be discharged to:: Private residence Living Arrangements: Children Available Help at Discharge: Family;Available 24 hours/day Type of  Home: House Home Access: Stairs to enter [employer] of Steps: 2   Home Layout: One level     Bathroom Shower/Tub: Chief Strategy Officer: Standard     Home Equipment: None          Prior Functioning/Environment Level of Independence: Independent                 OT Problem List: Decreased activity tolerance;Impaired balance (sitting and/or standing);Pain      OT Treatment/Interventions: Self-care/ADL training;Therapeutic  exercise;DME and/or AE instruction;Balance training;Therapeutic activities    OT Goals(Current goals can be found in the care plan section) Acute Rehab OT Goals Patient Stated Goal: just be able to move a little OT Goal Formulation: With patient Time For Goal Achievement: 07/09/20 Potential to Achieve Goals: Good ADL Goals Pt Will Perform Grooming: with modified independence;standing;sitting Pt Will Perform Lower Body Bathing: with modified independence;sit to/from stand Pt Will Perform Lower Body Dressing: with modified independence;sit to/from stand Pt Will Transfer to Toilet: with modified independence;ambulating;regular height toilet Pt Will Perform Toileting - Clothing Manipulation and hygiene: Independently;sit to/from stand  OT Frequency: Min 2X/week   Barriers to D/C:    none noted       Co-evaluation              AM-PAC OT "6 Clicks" Daily Activity     Outcome Measure Help from another person eating meals?: None Help from another person taking care of personal grooming?: None Help from another person toileting, which includes using toliet, bedpan, or urinal?: A Little Help from another person bathing (including washing, rinsing, drying)?: A Little Help from another person to put on and taking off regular upper body clothing?: A Little Help from another person to put on and taking off regular lower body clothing?: A Lot 6 Click Score: 19   End of Session Equipment Utilized During Treatment: Rolling walker;Oxygen Nurse Communication: Mobility status  Activity Tolerance: Patient limited by pain Patient left: in bed  OT Visit Diagnosis: Unsteadiness on feet (R26.81);Pain                Time: 1740-8144 OT Time Calculation (min): 29 min Charges:  OT General Charges $OT Visit: 1 Visit OT Evaluation $OT Eval Moderate Complexity: 1 Mod OT Treatments $Self Care/Home Management : 8-22 mins  06/25/2020  Ronnie Larsen, OTR/L  Acute Rehabilitation Services  Office:   [phone]   Ronnie Larsen 06/25/2020, 9:13 AM

## 2020-06-25 NOTE — Plan of Care (Signed)

## 2020-06-25 NOTE — Progress Notes (Addendum)
  Progress Note    06/25/2020 8:01 AM 1 Day Post-Op  Subjective: no complaints this morning   Vitals:   06/25/20 0700 06/25/20 0722  BP: 132/69   Pulse: 86   Resp: 13   Temp:  98.1 F (36.7 C)  SpO2: 99%    Physical Exam: Cardiac: regular Lungs: non labored Incisions: laparotomy incision is clean, dry and intact. Expected tenderness. Right groin incision intact. There is a hematoma present with ecchymosis. Groin is soft but seems to be expanding hematoma present. It has expanded beyond marks from earlier this morning Extremities:  Well perfused and warm. Brisk doppler PT/ DP on right, monophasic DP and brisk PT left foot Abdomen:  Soft, expected tenderness along incision Neurologic: alert and oriented  CBC    Component Value Date/Time   WBC 19.3 (H) 06/24/2020 1312   RBC 4.69 06/24/2020 1312   HGB 14.4 06/24/2020 1312   HCT 44.1 06/24/2020 1312   PLT 168 06/24/2020 1312   MCV 94.0 06/24/2020 1312   MCH 30.7 06/24/2020 1312   MCHC 32.7 06/24/2020 1312   RDW 12.3 06/24/2020 1312    BMET    Component Value Date/Time   NA 138 06/24/2020 1312   K 4.1 06/24/2020 1312   CL 105 06/24/2020 1312   CO2 20 (L) 06/24/2020 1312   GLUCOSE 168 (H) 06/24/2020 1312   BUN 7 (L) 06/24/2020 1312   CREATININE 1.47 (H) 06/24/2020 1312   CALCIUM 8.1 (L) 06/24/2020 1312   GFRNONAA 54 (L) 06/24/2020 1312    INR    Component Value Date/Time   INR 1.3 (H) 06/24/2020 1312     Intake/Output Summary (Last 24 hours) at 06/25/2020 0801 Last data filed at 06/25/2020 0700 Gross per 24 hour  Intake 5502.77 ml  Output 2815 ml  Net 2687.77 ml     Assessment/Plan:  65 y.o. male is s/p Aortobifemoral bypass with 14 x 8 Hemashield graft, extensive endarterectomy from the external iliac artery down onto the superficial femoral artery and profunda femoris arteries on the right with Dacron patch angioplasty 1 Day Post-Op. Right groin hematoma seems to be expanding. Morning labs pending. VSS.  Keep NPO. Dr. Arbie Cookey is going to take patient to OR to washout right groin this morning  [name], [place] [facility] [phone] 06/25/2020 8:01 AM   I have examined the patient, reviewed and agree with above.  Gretta Began, MD 06/25/2020 10:19 AM I have examined the patient, reviewed and agree with above.  Looks great this morning.  No nausea or vomiting with NG discontinued at OR.  Expected soreness.  Good perfusion to his feet bilaterally.  Does have moderate hematoma in his right groin.  This is not expansile.  With the extensive amount of prosthetic material in his groin, feel it is safest to return to the operating room for washout.  Discussed at length with the patient who understands.  Will keep n.p.o. for surgery and plan liquids on return to his room  Gretta Began, MD 06/25/2020 10:21 AM

## 2020-06-25 NOTE — Anesthesia Preprocedure Evaluation (Signed)
Anesthesia Evaluation  Patient identified by MRN, date of birth, ID band Patient awake    Reviewed: Allergy & Precautions, NPO status , Patient's Chart, lab work & pertinent test results  Airway Mallampati: II  TM Distance: >3 FB Neck ROM: Full    Dental  (+) Dental Advisory Given,    Pulmonary Current Smoker and Patient abstained from smoking.,    breath sounds clear to auscultation       Cardiovascular  Rhythm:Regular Rate:Normal     Neuro/Psych    GI/Hepatic   Endo/Other    Renal/GU      Musculoskeletal   Abdominal   Peds  Hematology   Anesthesia Other Findings   Reproductive/Obstetrics                             Anesthesia Physical Anesthesia Plan  ASA: III  Anesthesia Plan: General   Post-op Pain Management:    Induction: Intravenous  PONV Risk Score and Plan: Ondansetron and Dexamethasone  Airway Management Planned: Oral ETT  Additional Equipment:   Intra-op Plan:   Post-operative Plan:   Informed Consent: I have reviewed the patients History and Physical, chart, labs and discussed the procedure including the risks, benefits and alternatives for the proposed anesthesia with the patient or authorized representative who has indicated his/her understanding and acceptance.     Dental advisory given  Plan Discussed with: Anesthesiologist and CRNA  Anesthesia Plan Comments:         Anesthesia Quick Evaluation

## 2020-06-25 NOTE — Transfer of Care (Signed)
Immediate Anesthesia Transfer of Care Note  Patient: Ronnie Larsen.  Procedure(s) Performed: EVACUATION HEMATOMA OF RIGHT GROIN, (Right Groin)  Patient Location: PACU  Anesthesia Type:General  Level of Consciousness: drowsy  Airway & Oxygen Therapy: Patient Spontanous Breathing and Patient connected to nasal cannula oxygen  Post-op Assessment: Report given to RN and Post -op Vital signs reviewed and stable  Post vital signs: Reviewed and stable  Last Vitals:  Vitals Value Taken Time  BP 153/56 06/25/20 1544  Temp    Pulse 113 06/25/20 1545  Resp 22 06/25/20 1545  SpO2 93 % 06/25/20 1545  Vitals shown include unvalidated device data.  Last Pain:  Vitals:   06/25/20 1100  TempSrc: Oral  PainSc:       Patients Stated Pain Goal: 5 (06/24/20 0518)  Complications: No complications documented.

## 2020-06-25 NOTE — Progress Notes (Signed)
Hematoma to right groin has spread beyond second marking and bruising has wrapped around towards back of right thigh. Patient remains hemodynamically stable. Will make day nurse aware and prepare for possible washout per Dr. Edilia Bo vascular MD

## 2020-06-25 NOTE — Anesthesia Procedure Notes (Signed)
Procedure Name: Intubation Date/Time: 06/25/2020 2:55 PM Performed by: Eligha Bridegroom, CRNA Pre-anesthesia Checklist: Patient identified, Emergency Drugs available, Suction available, Patient being monitored and Timeout performed Patient Re-evaluated:Patient Re-evaluated prior to induction Oxygen Delivery Method: Circle system utilized Preoxygenation: Pre-oxygenation with 100% oxygen Induction Type: IV induction Ventilation: Oral airway inserted - appropriate to patient size and Mask ventilation without difficulty Laryngoscope Size: Mac and 4 Grade View: Grade II Tube type: Oral Tube size: 7.5 mm Number of attempts: 1 Airway Equipment and Method: Stylet Secured at: 21 cm Tube secured with: Tape Dental Injury: Teeth and Oropharynx as per pre-operative assessment

## 2020-06-25 NOTE — Op Note (Signed)
    OPERATIVE REPORT  DATE OF SURGERY: 06/25/2020  PATIENT: Ronnie Better., 65 y.o. male MRN: [id number]  DOB: [birth year]  PRE-OPERATIVE DIAGNOSIS: Hematoma right groin  POST-OPERATIVE DIAGNOSIS:  Same  PROCEDURE: Evacuation hematoma right groin  SURGEON:  Gretta Began, M.D.  PHYSICIAN ASSISTANT: Setzer PA C  The assistant was needed for exposure and to expedite the case  ANESTHESIA: General  EBL: per anesthesia record  Total I/O In: 500 [IV Piggyback:500] Out: -   BLOOD ADMINISTERED: none  DRAINS: none  SPECIMEN: none  COUNTS CORRECT:  YES  PATIENT DISPOSITION:  PACU - hemodynamically stable  PROCEDURE DETAILS: Patient was taken to room placed position where the area of the right groin prepped draped in sterile fashion.  The incision was opened from prior aortobifemoral yesterday.  There was no active bleeding.  There was a moderate hematoma in the right groin and this was evacuated.  The wound was irrigated and there was no evidence of any bleeding.  The wound was closed with 2-0 Vicryl in the subcutaneous tissue and the skin was closed with 3-0 subcuticular Vicryl stitch.   Ronnie Larsen, M.D., [facility] 06/25/2020 3:28 PM  Note: Portions of this report may have been transcribed using voice recognition software.  Every effort has been made to ensure accuracy; however, inadvertent computerized transcription errors may still be present.

## 2020-06-25 NOTE — Anesthesia Postprocedure Evaluation (Signed)
Anesthesia Post Note  Patient: [name].  Procedure(s) Performed: EVACUATION HEMATOMA OF RIGHT GROIN, (Right Groin)     Patient location during evaluation: PACU Anesthesia Type: General Level of consciousness: awake and alert Pain management: pain level controlled Vital Signs Assessment: post-procedure vital signs reviewed and stable Respiratory status: spontaneous breathing, nonlabored ventilation, respiratory function stable and patient connected to nasal cannula oxygen Cardiovascular status: blood pressure returned to baseline and stable Postop Assessment: no apparent nausea or vomiting Anesthetic complications: no   No complications documented.  Last Vitals:  Vitals:   06/25/20 1700 06/25/20 1730  BP: 133/71 123/70  Pulse: (!) 112 (!) 113  Resp: 12 (!) 22  Temp:  36.8 C  SpO2: 97% 96%    Last Pain:  Vitals:   06/25/20 1730  TempSrc:   PainSc: 0-No pain                 Ayinde Swim COKER

## 2020-06-26 ENCOUNTER — Encounter (HOSPITAL_COMMUNITY): Payer: Self-pay | Admitting: Vascular Surgery

## 2020-06-26 LAB — BASIC METABOLIC PANEL
Anion gap: 8 (ref 5–15)
BUN: 20 mg/dL (ref 8–23)
CO2: 24 mmol/L (ref 22–32)
Calcium: 8 mg/dL — ABNORMAL LOW (ref 8.9–10.3)
Chloride: 107 mmol/L (ref 98–111)
Creatinine, Ser: 1.44 mg/dL — ABNORMAL HIGH (ref 0.61–1.24)
GFR, Estimated: 55 mL/min — ABNORMAL LOW (ref >60)
Glucose, Bld: 148 mg/dL — ABNORMAL HIGH (ref 70–99)
Potassium: 4.4 mmol/L (ref 3.5–5.1)
Sodium: 139 mmol/L (ref 135–145)

## 2020-06-26 LAB — CBC
HCT: 28.2 % — ABNORMAL LOW (ref 39.0–52.0)
Hemoglobin: 9.6 g/dL — ABNORMAL LOW (ref 13.0–17.0)
MCH: 32 pg (ref 26.0–34.0)
MCHC: 34 g/dL (ref 30.0–36.0)
MCV: 94 fL (ref 80.0–100.0)
Platelets: 122 10*3/uL — ABNORMAL LOW (ref 150–400)
RBC: 3 MIL/uL — ABNORMAL LOW (ref 4.22–5.81)
RDW: 13 % (ref 11.5–15.5)
WBC: 13.4 10*3/uL — ABNORMAL HIGH (ref 4.0–10.5)
nRBC: 0 % (ref 0.0–0.2)

## 2020-06-26 MED ORDER — SODIUM CHLORIDE 0.9% FLUSH
10.0000 mL | Freq: Two times a day (BID) | INTRAVENOUS | Status: DC
  Administered 2020-06-26 – 2020-06-27 (×3): 10 mL

## 2020-06-26 MED ORDER — BISACODYL 10 MG RE SUPP
10.0000 mg | Freq: Once | RECTAL | Status: AC
  Administered 2020-06-26: 10 mg via RECTAL
  Filled 2020-06-26: qty 1

## 2020-06-26 MED ORDER — SODIUM CHLORIDE 0.9% FLUSH
10.0000 mL | INTRAVENOUS | Status: DC | PRN
Start: 2020-06-26 — End: 2020-06-29

## 2020-06-26 NOTE — Progress Notes (Signed)
Physical Therapy Treatment Patient Details Name: Ronnie Larsen. MRN: [id number] DOB: April 11, 1956 Today's Date: 06/26/2020    History of Present Illness 65 y.o. male multilevel arterial insufficiency with severe intermittent claudication.  UnderwentvAortobifemoral bypass grafting.    PT Comments    Pt sitting up in recliner all morning, agreeable to ambulating with therapy, despite continued groin pain especially with movement. Pt is supervision to come to standing and light min A for ambulation of  60 feet with RW. Pt requires min A for bringing LE back into bed at end of ambulation. Pt is making good progress towards his goals and d/c plans remain appropriate. PT will continue to follow acutely.    Follow Up Recommendations  Home health PT;Supervision for mobility/OOB     Equipment Recommendations  Rolling walker with 5" wheels       Precautions / Restrictions Precautions Precautions: Fall Restrictions Weight Bearing Restrictions: No    Mobility  Bed Mobility Overal bed mobility: Needs Assistance Bed Mobility: Rolling;Sidelying to Sit Rolling: Min guard       Sit to sidelying: Min assist General bed mobility comments: min A for bringing LE back into bed and managing lines and leads    Transfers Overall transfer level: Needs assistance Equipment used: Rolling walker (2 wheeled) Transfers: Sit to/from [employer] Sit to Stand: Supervision         General transfer comment: supervision for safety  Ambulation/Gait Ambulation/Gait assistance: Min assist Gait Distance (Feet): 60 Feet Assistive device: Rolling walker (2 wheeled) (EVA walker) Gait Pattern/deviations: Decreased step length - right;Decreased step length - left;Trunk flexed;Step-through pattern Gait velocity: slowed Gait velocity interpretation: <1.8 ft/sec, indicate of risk for recurrent falls General Gait Details: contact guard assist for safety with slowed, antalgic gait       Balance Overall balance assessment: Needs assistance Sitting-balance support: Feet supported Sitting balance-Leahy Scale: Good     Standing balance support: Bilateral upper extremity supported Standing balance-Leahy Scale: Poor                              Cognition Arousal/Alertness: Awake/alert Behavior During Therapy: Flat affect Overall Cognitive Status: Within Functional Limits for tasks assessed                                           General Comments General comments (skin integrity, edema, etc.): VSS on RA      Pertinent Vitals/Pain Pain Assessment: Faces Faces Pain Scale: Hurts whole lot Pain Location: surgical site especially with transfers Pain Descriptors / Indicators: Tender;Grimacing;Guarding Pain Intervention(s): Limited activity within patient's tolerance;Monitored during session;Repositioned           PT Goals (current goals can now be found in the care plan section) Acute Rehab PT Goals Patient Stated Goal: just be able to move a little PT Goal Formulation: With patient/family Time For Goal Achievement: 07/09/20 Potential to Achieve Goals: Good    Frequency    Min 3X/week      PT Plan Current plan remains appropriate    Co-evaluation              AM-PAC PT "6 Clicks" Mobility   Outcome Measure  Help needed turning from your back to your side while in a flat bed without using bedrails?: None Help needed moving from lying on your back to  sitting on the side of a flat bed without using bedrails?: A Little Help needed moving to and from a bed to a chair (including a wheelchair)?: A Little Help needed standing up from a chair using your arms (e.g., wheelchair or bedside chair)?: A Little Help needed to walk in hospital room?: A Little Help needed climbing 3-5 steps with a railing? : A Lot 6 Click Score: 18    End of Session   Activity Tolerance: Patient limited by pain Patient left: in bed;with call  bell/phone within reach;with family/visitor present Nurse Communication: Mobility status;Patient requests pain meds PT Visit Diagnosis: Unsteadiness on feet (R26.81);Other abnormalities of gait and mobility (R26.89);Difficulty in walking, not elsewhere classified (R26.2);Pain Pain - part of body:  (groin incision)     Time: 3419-3790 PT Time Calculation (min) (ACUTE ONLY): 17 min  Charges:  $Gait Training: 8-22 mins                     Umer Harig B. Beverely Risen PT, DPT Acute Rehabilitation Services Pager [phone] Office [phone]    Elon Alas Fleet 06/26/2020, 1:22 PM

## 2020-06-26 NOTE — Progress Notes (Addendum)
Aortic Surgery Progress Note    06/26/2020 7:03 AM 1 Day Post-Op  Subjective:  C/o being cold.  Says the bandage on the right groin was just changed bc it was oozing.  Denies any N/V and is taking it slow with his intake.    Afebrile HR 80's-100's  110's-160's systolic 99% 2LO2NC  Gtts:  none  Vitals:   06/26/20 0500 06/26/20 0600  BP:    Pulse: 94 95  Resp: 20 17  Temp:    SpO2: 99% 98%    Physical Exam: Cardiac:  regular Lungs:  Non labored Abdomen:  Mildly distended.  +BS; -BM Incisions:  Midline incision is clean and dry; bandage to right groin with mild bloody drainage and left groin is clean and dry Extremities:  Brisk doppler signals bilateral feet General:  Sitting up in chair in no distress  CBC    Component Value Date/Time   WBC 16.7 (H) 06/25/2020 0730   RBC 3.94 (L) 06/25/2020 0730   HGB 12.1 (L) 06/25/2020 0730   HCT 37.0 (L) 06/25/2020 0730   PLT 183 06/25/2020 0730   MCV 93.9 06/25/2020 0730   MCH 30.7 06/25/2020 0730   MCHC 32.7 06/25/2020 0730   RDW 12.4 06/25/2020 0730    BMET    Component Value Date/Time   NA 137 06/25/2020 0730   K 4.6 06/25/2020 0730   CL 106 06/25/2020 0730   CO2 20 (L) 06/25/2020 0730   GLUCOSE 139 (H) 06/25/2020 0730   BUN 20 06/25/2020 0730   CREATININE 1.62 (H) 06/25/2020 0730   CALCIUM 7.5 (L) 06/25/2020 0730   GFRNONAA 48 (L) 06/25/2020 0730    INR    Component Value Date/Time   INR 1.3 (H) 06/24/2020 1312     Intake/Output Summary (Last 24 hours) at 06/26/2020 0703 Last data filed at 06/26/2020 0400 Gross per 24 hour  Intake 2521.66 ml  Output 950 ml  Net 1571.66 ml     Assessment/Plan:  65 y.o. male is s/p  Aortobifemoral bypass with 14 x 8 Hemashield graft, extensive endarterectomy from the external iliac artery down onto the superficial femoral artery and profunda femoris arteries on the right with Dacron patch angioplasty 2 Days Post-Op And Evacuation hematoma right groin 1 Day  Post-Op   -Vascular:  Brisk doppler signals bilateral feet.   -Cardiac:  Hemodynamically stable not requiring any pressor support -Pulmonary:  Still on 1LO2NC-work on IS, mobilize and wean O2 -Neuro:  In tact -Renal:  UOP at 950/24hr.  Creatinine was 1.62 yesterday.  Will repeat labs today. -GI:  +BS but no BM.  Tolerating clear liquids.  Will give dulcolax supp today.  -Incisions:  Laparotomy incision and left groin clean and dry.  Right groin with bandage with mild bloody drainage present.   -Heme/ID:  Pt is afebrile with mild leukocytosis yesterday most likely perioperative; hgb yesterday stable.   -General:  No distress  -mobilize today & work on IS -check labs today -dulcolax supp -probable transfer to 4 east later today. -dry gauze to left groin to wick moisture to help prevent wound infection.  Continue bandage to right groin as needed for bloody drainage.    Doreatha Massed, PA-C [facility] [phone] 06/26/2020 7:03 AM   I have independently interviewed and examined patient and agree with PA assessment and plan above. Hematoma in right groin but appears to be stable. Can transfer to floor and begin to de-line.   Nain Rudd C. Randie Heinz, MD [facility]  of [place] Office: [phone] Pager: [phone]

## 2020-06-26 NOTE — Plan of Care (Signed)
  Problem: Education: Goal: Knowledge of General Education information will improve Description: Including pain rating scale, medication(s)/side effects and non-pharmacologic comfort measures Outcome: Progressing   Problem: Clinical Measurements: Goal: Cardiovascular complication will be avoided Outcome: Progressing   Problem: Activity: Goal: Risk for activity intolerance will decrease Outcome: Progressing   Problem: Coping: Goal: Level of anxiety will decrease Outcome: Progressing   Problem: Elimination: Goal: Will not experience complications related to bowel motility Outcome: Progressing   Problem: Elimination: Goal: Will not experience complications related to urinary retention Outcome: Progressing   Problem: Pain Managment: Goal: General experience of comfort will improve Outcome: Progressing   Problem: Safety: Goal: Ability to remain free from injury will improve Outcome: Progressing   Problem: Skin Integrity: Goal: Risk for impaired skin integrity will decrease Outcome: Progressing

## 2020-06-27 MED ORDER — BISACODYL 10 MG RE SUPP
10.0000 mg | Freq: Once | RECTAL | Status: AC
  Administered 2020-06-27: 10 mg via RECTAL
  Filled 2020-06-27: qty 1

## 2020-06-27 MED ORDER — PANTOPRAZOLE SODIUM 40 MG PO TBEC
40.0000 mg | DELAYED_RELEASE_TABLET | Freq: Every day | ORAL | Status: DC
  Administered 2020-06-28 – 2020-06-29 (×2): 40 mg via ORAL
  Filled 2020-06-27 (×2): qty 1

## 2020-06-27 NOTE — Progress Notes (Signed)
Patient transferred to 4E26, handover report given to [facility]

## 2020-06-27 NOTE — Progress Notes (Signed)
Occupational Therapy Treatment Patient Details Name: Ronnie Larsen. MRN: [id number] DOB: January 05, 1956 Today's Date: 06/27/2020    History of present illness 65 y.o. male multilevel arterial insufficiency with severe intermittent claudication.  UnderwentvAortobifemoral bypass grafting.   OT comments  Patient found up in the recliner, continues to have abdominal pain limiting mobility.  Attempted to mobilize to toilet and try to have BM.  Patient continues to pass gas.  Able to walk with [facility], perform light grooming at sink with supervision, and walk back to the recliner.  Limited ADL progression due to pain, but he is beginning to move a little better.  OT will continue in the acute setting to assist with discharge home.  HH OT has not been recommended, but could be considered depending on progress.    Follow Up Recommendations  No OT follow up    Equipment Recommendations  Tub/shower seat    Recommendations for Other Services      Precautions / Restrictions Precautions Precautions: Fall Restrictions Weight Bearing Restrictions: No       Mobility Bed Mobility               General bed mobility comments: In recliner upon entering  Transfers Overall transfer level: Needs assistance Equipment used: Rolling walker (2 wheeled) Transfers: Sit to/from Stand Sit to Stand: Supervision              Balance           Standing balance support: Bilateral upper extremity supported Standing balance-Leahy Scale: Poor Standing balance comment: relies on RW                           ADL either performed or assessed with clinical judgement   ADL       Grooming: Wash/dry hands;Supervision/safety;Standing                   Toilet Transfer: Chief Financial Officer and Hygiene: Sit to/from stand;Supervision/safety       Functional mobility during ADLs: Min guard                          Cognition Arousal/Alertness: Awake/alert Behavior During Therapy: Flat affect Overall Cognitive Status: Within Functional Limits for tasks assessed                                                            Pertinent Vitals/ Pain       Faces Pain Scale: Hurts whole lot Pain Location: surgical site especially with mobility Pain Descriptors / Indicators: Tender;Grimacing;Guarding Pain Intervention(s): Monitored during session                                                          Frequency  Min 2X/week        Progress Toward Goals  OT Goals(current goals can now be found in the care plan section)  Progress towards OT goals: Progressing toward goals  Acute Rehab OT Goals Patient Stated Goal: less pain so I can  go home OT Goal Formulation: With patient Time For Goal Achievement: 07/09/20 Potential to Achieve Goals: Good  Plan Discharge plan remains appropriate    Co-evaluation                 AM-PAC OT "6 Clicks" Daily Activity     Outcome Measure   Help from another person eating meals?: None Help from another person taking care of personal grooming?: A Little Help from another person toileting, which includes using toliet, bedpan, or urinal?: A Little Help from another person bathing (including washing, rinsing, drying)?: A Lot Help from another person to put on and taking off regular upper body clothing?: A Little Help from another person to put on and taking off regular lower body clothing?: A Lot 6 Click Score: 17    End of Session Equipment Utilized During Treatment: Rolling walker;Oxygen  OT Visit Diagnosis: Unsteadiness on feet (R26.81);Pain   Activity Tolerance Patient limited by pain   Patient Left in chair;with call bell/phone within reach   Nurse Communication          Time: 0804-0820 OT Time Calculation (min): 16 min  Charges: OT General Charges $OT Visit: 1 Visit OT  Treatments $Self Care/Home Management : 8-22 mins  06/27/2020  Rich, OTR/L  Acute Rehabilitation Services  Office:  [phone]    [name] 06/27/2020, 3:15 PM

## 2020-06-27 NOTE — Progress Notes (Addendum)
Aortic Surgery Progress Note    06/27/2020 7:19 AM 2 Days Post-Op  Subjective:  No complaints; sitting up in chair.  Says he's passing a lot of gas but no BM.    Afebrile HR 70's-90's NSR 120's-140's systolic 95% RA  Gtts:  none  Vitals:   06/27/20 0600 06/27/20 0713  BP: 134/70   Pulse: 78   Resp: (!) 23   Temp:  98.3 F (36.8 C)  SpO2: 100%     Physical Exam: Cardiac:  regular Lungs:  Non labored on room air Abdomen:  +BS; +flatus; mildly distended Incisions:  Midline and left groin clean and dry; right groin still with some drainage and bandage in place Extremities:  Bilateral feet are warm and well perfused.  General:  Sitting in chair in no distress  CBC    Component Value Date/Time   WBC 13.4 (H) 06/26/2020 0720   RBC 3.00 (L) 06/26/2020 0720   HGB 9.6 (L) 06/26/2020 0720   HCT 28.2 (L) 06/26/2020 0720   PLT 122 (L) 06/26/2020 0720   MCV 94.0 06/26/2020 0720   MCH 32.0 06/26/2020 0720   MCHC 34.0 06/26/2020 0720   RDW 13.0 06/26/2020 0720    BMET    Component Value Date/Time   NA 139 06/26/2020 0720   K 4.4 06/26/2020 0720   CL 107 06/26/2020 0720   CO2 24 06/26/2020 0720   GLUCOSE 148 (H) 06/26/2020 0720   BUN 20 06/26/2020 0720   CREATININE 1.44 (H) 06/26/2020 0720   CALCIUM 8.0 (L) 06/26/2020 0720   GFRNONAA 55 (L) 06/26/2020 0720    INR    Component Value Date/Time   INR 1.3 (H) 06/24/2020 1312     Intake/Output Summary (Last 24 hours) at 06/27/2020 0719 Last data filed at 06/27/2020 0600 Gross per 24 hour  Intake 3748.84 ml  Output 2900 ml  Net 848.84 ml     Assessment/Plan:  65 y.o. male is s/p  Aortobifemoral bypass with 14 x 8 Hemashield graft, extensive endarterectomy from the external iliac artery down onto the superficial femoral artery and profunda femoris arteries on the right with Dacron patch angioplasty 3 Days Post-Op And Evacuation hematoma right groin 2 Days Post-Op  -Vascular:  Bilateral feet are warm and  well perfused -Cardiac:  Hemodynamically stable -Pulmonary:  Now on room air; continue IS -Neuro:  In tact -Renal:  UOP improved from yesterday.  Creatinine down to 1.44 on yesterdays labs.  Decrease IVF to 50cc/hr.  Check labs in am.  -GI:  +flatus but no BM.  Continue clear liquids until BM.  Will give another supp this morning -Incisions:  Right groin still with some drainage, otherwise, incisions look good and healing.   -Heme/ID:  Leukocytosis improved on labs yesterday.  Pt remains afebrile. Thrombocytopenia dropped to 123k.  Will check again tomorrow.  Acute blood loss anemia with hgb of 9.6 yesterday, down from 12.1.  Probably some dilutional component.  Decrease IVF and will check labs again in the morning. If platelets drop again, may want to consider checking HIT panel.  -General:  Looks good sitting in chair.    -tolerating clears but no BM yet.  Will give another dulcolax today -PT recommending HHPT-face to face and HH orders placed as well as consult to TOC.  -CBC, BMP in am -transfer orders written yesterday but no beds available on 4E.  Hopefully can be transferred today. -discussed groin wound care and importance of keeping groins dry   Doreatha Massed, PA-C  [facility] [phone] 06/27/2020 7:19 AM   I have independently interviewed and examined patient and agree with PA assessment and plan above.   Meko Bellanger C. Randie Heinz, MD [facility] [place] Office: [phone] Pager: [phone]

## 2020-06-28 LAB — CBC
HCT: 25.8 % — ABNORMAL LOW (ref 39.0–52.0)
Hemoglobin: 8.4 g/dL — ABNORMAL LOW (ref 13.0–17.0)
MCH: 31 pg (ref 26.0–34.0)
MCHC: 32.6 g/dL (ref 30.0–36.0)
MCV: 95.2 fL (ref 80.0–100.0)
Platelets: 130 10*3/uL — ABNORMAL LOW (ref 150–400)
RBC: 2.71 MIL/uL — ABNORMAL LOW (ref 4.22–5.81)
RDW: 12.8 % (ref 11.5–15.5)
WBC: 7.7 10*3/uL (ref 4.0–10.5)
nRBC: 0 % (ref 0.0–0.2)

## 2020-06-28 LAB — BASIC METABOLIC PANEL
Anion gap: 9 (ref 5–15)
BUN: 16 mg/dL (ref 8–23)
CO2: 24 mmol/L (ref 22–32)
Calcium: 8.3 mg/dL — ABNORMAL LOW (ref 8.9–10.3)
Chloride: 107 mmol/L (ref 98–111)
Creatinine, Ser: 1.29 mg/dL — ABNORMAL HIGH (ref 0.61–1.24)
GFR, Estimated: >60 mL/min (ref >60)
Glucose, Bld: 108 mg/dL — ABNORMAL HIGH (ref 70–99)
Potassium: 3.9 mmol/L (ref 3.5–5.1)
Sodium: 140 mmol/L (ref 135–145)

## 2020-06-28 NOTE — Progress Notes (Addendum)
[facility] [place]  Subjective  - Passe gas and had BM   Objective 138/69 79 98 F (36.7 C) (Oral) (!) 22 100%  Intake/Output Summary (Last 24 hours) at 06/28/2020 7673 Last data filed at 06/28/2020 0500 Gross per 24 hour  Intake 1122.79 ml  Output 1450 ml  Net -327.21 ml   Mid line incision healing well, groins soft without hematoma + BS Flatus and +BM Feet warm and well perfused Lungs non labored    Assessment/Planning: 65 y.o. male is s/p  Aortobifemoral bypass with 14 x 8 Hemashield graft, extensive endarterectomy from the external iliac artery down onto the superficial femoral artery and profunda femoris arteries on the right with Dacron patch angioplasty 3 Days Post-Op  Good inflow with well perfused LE Tolerated liquid diet without N/V will advance diet Incisions healing well and had BM WBC WNL 7.7 Encouraged mobility Stable disposition plan for discharge once pain controlled, mobility, and tolerating full diet Cr decreased 1.29 from 1.44 urine Op > 1400 ml  Ronnie Larsen 06/28/2020 9:18 AM --  Laboratory Lab Results: Recent Labs    06/26/20 0720 06/28/20 0256  WBC 13.4* 7.7  HGB 9.6* 8.4*  HCT 28.2* 25.8*  PLT 122* 130*   BMET Recent Labs    06/26/20 0720 06/28/20 0256  NA 139 140  K 4.4 3.9  CL 107 107  CO2 24 24  GLUCOSE 148* 108*  BUN 20 16  CREATININE 1.44* 1.29*  CALCIUM 8.0* 8.3*    COAG Lab Results  Component Value Date   INR 1.3 (H) 06/24/2020   INR 1.0 06/23/2020   No results found for: PTT  I have seen and evaluated the patient and agree with the above plan.  We will advance his diet today and continue with mobilization.  Thrombocytopenia and creatinine both improved today  Wells Brabham

## 2020-06-28 NOTE — Progress Notes (Signed)
Mobility Specialist: Progress Note   06/28/20 1308  Mobility  Activity Ambulated in hall  Level of Assistance Modified independent, requires aide device or extra time  Assistive Device Front wheel walker  Distance Ambulated (ft) 230 ft  Mobility Response Tolerated well  Mobility performed by Mobility specialist  $Mobility charge 1 Mobility   Pre-Mobility: 88 HR, 100% SpO2 During-Mobility: 102 BP Post-Mobility: 82 HR, 158/69 BP, 100% SpO2  Pt c/o 6-7/10 pain in abdomin pre-mobility and said his pain level remained the same throughout ambulation. Pt back to bed per request. RN notified of pt's pain level.   [facility] Kathye Cipriani Mobility Specialist Mobility Specialist Phone: [phone]

## 2020-06-29 LAB — CBC
HCT: 26.7 % — ABNORMAL LOW (ref 39.0–52.0)
Hemoglobin: 8.9 g/dL — ABNORMAL LOW (ref 13.0–17.0)
MCH: 31.1 pg (ref 26.0–34.0)
MCHC: 33.3 g/dL (ref 30.0–36.0)
MCV: 93.4 fL (ref 80.0–100.0)
Platelets: 132 10*3/uL — ABNORMAL LOW (ref 150–400)
RBC: 2.86 MIL/uL — ABNORMAL LOW (ref 4.22–5.81)
RDW: 12.8 % (ref 11.5–15.5)
WBC: 7.8 10*3/uL (ref 4.0–10.5)
nRBC: 0.3 % — ABNORMAL HIGH (ref 0.0–0.2)

## 2020-06-29 LAB — BASIC METABOLIC PANEL
Anion gap: 11 (ref 5–15)
BUN: 15 mg/dL (ref 8–23)
CO2: 22 mmol/L (ref 22–32)
Calcium: 7.9 mg/dL — ABNORMAL LOW (ref 8.9–10.3)
Chloride: 106 mmol/L (ref 98–111)
Creatinine, Ser: 1.32 mg/dL — ABNORMAL HIGH (ref 0.61–1.24)
GFR, Estimated: >60 mL/min (ref >60)
Glucose, Bld: 110 mg/dL — ABNORMAL HIGH (ref 70–99)
Potassium: 3.4 mmol/L — ABNORMAL LOW (ref 3.5–5.1)
Sodium: 139 mmol/L (ref 135–145)

## 2020-06-29 MED ORDER — OXYCODONE-ACETAMINOPHEN 5-325 MG PO TABS: 1.0000 | ORAL_TABLET | Freq: Four times a day (QID) | ORAL | 0 refills | Status: AC | PRN

## 2020-06-29 NOTE — TOC Transition Note (Signed)
Transition of Care (TOC) - CM/SW Discharge Note Donn Pierini RN, BSN Transitions of Care Unit 4E- RN Case Manager See Treatment Team for direct phone #    Patient Details  Name: Ronnie Larsen. MRN: [id number] Date of Birth: 1956/02/08  Transition of Care [facility]) CM/SW Contact:  Darrold Span, RN Phone Number: 06/29/2020, 1:10 PM   Clinical Narrative:    Pt stable for transition home today, orders for [facility] and DME, CM in to speak with pt at bedside- discussed DME and HH- per pt he would like RW For home but does not feel like he will need HH services at this time- declines HH referral.  Call made to [employer] line for DME need- RW to be delivered to room prior to discharge.    Final next level of care: Home/Self Care Barriers to Discharge: Equipment Delay   Patient Goals and CMS Choice Patient states their goals for this hospitalization and ongoing recovery are:: return home CMS Medicare.gov Compare Post Acute Care list provided to:: Patient Choice offered to / list presented to : Patient  Discharge Placement                home        Discharge Plan and Services   Discharge Planning Services: CM Consult Post Acute Care Choice: Durable Medical Equipment,Home Health          DME Arranged: Dan Humphreys rolling DME Agency: [employer] Date DME Agency Contacted: 06/29/20 Time DME Agency Contacted: [phone] Representative spoke with at DME Agency: Eyvonne Mechanic HH Arranged: PT,OT,Patient Refused HH          Social Determinants of Health (SDOH) Interventions     Readmission Risk Interventions No flowsheet data found.

## 2020-06-29 NOTE — Progress Notes (Signed)
Ronnie Larsen was delivered to room and delivery staff states they would mail the walker to the patient's residence on file.   -Estella Husk, RN

## 2020-06-29 NOTE — Progress Notes (Addendum)
[facility] [place]  Subjective  - Ambulating and tolerating PO without n/v.   Objective (!) 147/67 70 97.8 F (36.6 C) (Oral) 17 100%  Intake/Output Summary (Last 24 hours) at 06/29/2020 0820 Last data filed at 06/29/2020 0500 Gross per 24 hour  Intake 120 ml  Output 800 ml  Net -680 ml   Incisions healing well, groins soft with ecchymosis right groin > left.   Abdomin soft + BS Feet warm and well perfused Lungs non labored breathing   Assessment/Planning: POD # 4 64 y.o.maleis s/p  Aortobifemoral bypass with 14 x 8 Hemashield graft, extensive endarterectomy from the external iliac artery down onto the superficial femoral artery and profunda femoris arteries on the right with Dacron patch angioplasty  Hematoma resolved with out reoccurrence after evacuation on right groin. He is ambulating tolerating PO's and had BM. Plan to discharge home in stable condition. Cr 1.32 He will f/U with Dr. Arbie Cookey in [place].   Mosetta Pigeon 06/29/2020 8:20 AM --  Laboratory Lab Results: Recent Labs    06/28/20 0256 06/29/20 0148  WBC 7.7 7.8  HGB 8.4* 8.9*  HCT 25.8* 26.7*  PLT 130* 132*   BMET Recent Labs    06/28/20 0256 06/29/20 0148  NA 140 139  K 3.9 3.4*  CL 107 106  CO2 24 22  GLUCOSE 108* 110*  BUN 16 15  CREATININE 1.29* 1.32*  CALCIUM 8.3* 7.9*    COAG Lab Results  Component Value Date   INR 1.3 (H) 06/24/2020   INR 1.0 06/23/2020   No results found for: PTT  I have seen and evaluated the patient, and agree with the above plan.  He is scheduled for discharge home today  Durene Cal

## 2020-06-29 NOTE — Progress Notes (Signed)
Patient given discharge instructions, medication list and follow up appointments. All questions answered. Will discharge home as ordered. trasported to exit via wheelchair and nursing staff. Jenyfer Trawick, [employer] rn

## 2020-06-29 NOTE — Progress Notes (Addendum)
Per Bedside RN, patient did not want to wait any longer on medical equipment.  Pt stated he will leave with out walker. Discharged as requested Ronnie Larsen, Ronnie An rN

## 2020-06-30 LAB — TYPE AND SCREEN
ABO/RH(D): A POS
Antibody Screen: NEGATIVE
Unit division: 0
Unit division: 0
Unit division: 0
Unit division: 0

## 2020-06-30 LAB — BPAM RBC
Blood Product Expiration Date: 202203012359
Blood Product Expiration Date: 202203012359
Blood Product Expiration Date: 202203012359
Blood Product Expiration Date: 202203012359
ISSUE DATE / TIME: 202202080840
ISSUE DATE / TIME: 202202080840
Unit Type and Rh: 6200
Unit Type and Rh: 6200
Unit Type and Rh: 6200
Unit Type and Rh: 6200

## 2020-06-30 NOTE — Discharge Summary (Signed)
[facility] Discharge Summary   Patient ID:  Ronnie Larsen. MRN: [id number] DOB/AGE: 1956/05/09 65 y.o.  Admit date: 06/24/2020 Discharge date: 06/29/20 Date of Surgery: 06/25/2020 Surgeon: Moishe Spice): Early, Kristen Loader, MD  Admission Diagnosis: Aortoiliac occlusive disease [facility]) [I74.09]  Discharge Diagnoses:  Aortoiliac occlusive disease (HCC) [I74.09]  Secondary Diagnoses: Past Medical History:  Diagnosis Date  . Bladder stones   . GERD (gastroesophageal reflux disease)   . Gout   . High cholesterol   . Medical history non-contributory   . PAD (peripheral artery disease) (HCC)   . Urinary tract infection     Procedure(s): EVACUATION HEMATOMA OF RIGHT GROIN,  Discharged Condition: stable  HPI: Ronnie Clabo. is a 65 y.o. male, who is here today for evaluation.  He reports severe intermittent claudication.  Cannot walk around his home without limitation.  Reports inability to walk uphill. Noninvasive studies reveal ankle arm index 1.46 on the right and 0.64 on the left  CT angio aorta and bifemoral from 05/08/2020 reviewed with the patient.  This reveals moderate 50% narrowing in the infrarenal aorta.  There are severe long segment narrowing of very small external iliac arteries bilaterally.  He has total occlusions of superficial femoral arteries in the mid thigh with reconstitution of the below-knee popliteal and three-vessel runoff bilaterally  Hospital Course:  Ronnie Orsak. is a 65 y.o. male is S/P  EVACUATION HEMATOMA OF RIGHT GROIN, Post op brisk dopplers right PT/DP, monophasic DP, brisk PT.  He had a groin hematoma on the right Dr. Arbie Cookey returned to the OR for exploration and evacuation of hematoma.    He progressed well after that.  POD # 4 he was stable with good inflow.  He had an elevated Cr which decreased to baseline prior to discharge.    He is ambulating tolerating PO's and had BM.  Discharge in stable condition.     Significant Diagnostic Studies: CBC Lab Results  Component Value Date   WBC 7.8 06/29/2020   HGB 8.9 (L) 06/29/2020   HCT 26.7 (L) 06/29/2020   MCV 93.4 06/29/2020   PLT 132 (L) 06/29/2020    BMET    Component Value Date/Time   NA 139 06/29/2020 0148   K 3.4 (L) 06/29/2020 0148   CL 106 06/29/2020 0148   CO2 22 06/29/2020 0148   GLUCOSE 110 (H) 06/29/2020 0148   BUN 15 06/29/2020 0148   CREATININE 1.32 (H) 06/29/2020 0148   CALCIUM 7.9 (L) 06/29/2020 0148   GFRNONAA >60 06/29/2020 0148   COAG Lab Results  Component Value Date   INR 1.3 (H) 06/24/2020   INR 1.0 06/23/2020     Disposition:  Discharge to :Home Discharge Instructions    Call MD for:  redness, tenderness, or signs of infection (pain, swelling, bleeding, redness, odor or green/yellow discharge around incision site)   Complete by: As directed    Call MD for:  severe or increased pain, loss or decreased feeling  in affected limb(s)   Complete by: As directed    Call MD for:  temperature >100.5   Complete by: As directed    Resume previous diet   Complete by: As directed      Allergies as of 06/29/2020      Reactions   Penicillins    Childhood       Medication List    TAKE these medications   alfuzosin 10 MG 24 hr tablet Commonly known as: UROXATRAL Take  1 tablet (10 mg total) by mouth daily with breakfast. Notes to patient: Take as you were prior to admission    aspirin EC 81 MG tablet Take 81 mg by mouth daily. Swallow whole. Notes to patient: Take as you were prior to admission    cilostazol 50 MG tablet Commonly known as: PLETAL Take 50 mg by mouth 2 (two) times daily.   omeprazole 20 MG capsule Commonly known as: PRILOSEC Take 20 mg by mouth daily.   oxyCODONE-acetaminophen 5-325 MG tablet Commonly known as: PERCOCET/ROXICET Take 1 tablet by mouth every 6 (six) hours as needed.   rosuvastatin 20 MG tablet Commonly known as: CRESTOR Take 20 mg by mouth daily. Notes to  patient: Take as you were prior to admission       Verbal and written Discharge instructions given to the patient. Wound care per Discharge AVS  Follow-up Information    Early, Kristen Loader, MD Follow up in 2 week(s).   Specialties: Vascular Surgery, Cardiology Contact information: [address] [place] [address] [phone]               Signed: Mosetta Pigeon 06/30/2020, 9:09 AM - For VQI Registry use --- Instructions: Press F2 to tab through selections.  Delete question if not applicable.   Post-op:  Time to Extubation: [x ] In OR, [ ]  <12 hrs, [ ]  12-24 hrs, [ ]  > 24 hrs Vasopressors: No  ICU Stay: 1 days  Transfusion: No  If yes, 0 units given New Arrhythmia: No Ipsilateral amputation: [x ] no, [ ]  Minor, [ ]  BKA, [ ]  AKA Discharge patency: [x ] Primary, [ ]  Primary assisted, [ ]  Secondary, [ ]  Occluded Patency judged by: [ ]  Dopper only, [ ]  Palpable graft pulse, [ ]  Palpable distal pulse, [ ]  ABI inc. > 0.15, [ ]  Duplex  D/C Ambulatory Status: Ambulatory  Complications: Wound complication: [x ] No, [ ]  Superficial, [ ]  Return to OR  Graft infection: No  Leg ischemia/emboli: [x ] No, [ ]  Yes, no Surgery, [ ]  Yes, Surgery req., [ ]  Amputation If amputation: side: [ ]  R: [ ]  minor, [ ]  BKA, [ ]  AKA; [ ]  L: [ ]  Minor, [ ]  BKA, [ ]  AKA  MI: [x ] No, [ ]  Troponin only, [ ]  EKG or Clinical CHF: No Resp failure: [ ]  none, [ ]  Pneumonia, [ ]  Ventilator Chg in renal function: [ ]  none, [ ]  Inc. Cr > 0.5, [ ]  Temp. Dialysis, [ ]  Permanent dialysis [x]  temporary change to Cr Stroke: [ ]  None, [ ]  Minor, [ ]  Major Return to OR: No  Reason for return to OR: [ ]  Bleeding, [ ]  Infection, [ ]  Thrombosis, [ ]  Revision  Discharge medications: Statin use:  Yes ASA use:  Yes Plavix use:  No  for medical reason   Beta blocker use: No  for medical reason   Coumadin use: No  for medical reason

## 2020-07-08 ENCOUNTER — Telehealth (HOSPITAL_COMMUNITY): Payer: Self-pay

## 2020-07-08 NOTE — Telephone Encounter (Signed)
All short term disability paper work was completed and faxed to [employer] [phone].  [employer]

## 2020-07-08 NOTE — Telephone Encounter (Signed)
All FMLA paper work was completed and faxed to [employer] [phone] in HR.   [employer]

## 2020-07-21 ENCOUNTER — Encounter: Payer: Self-pay | Admitting: Vascular Surgery

## 2020-07-21 ENCOUNTER — Other Ambulatory Visit: Payer: Self-pay

## 2020-07-21 ENCOUNTER — Ambulatory Visit (INDEPENDENT_AMBULATORY_CARE_PROVIDER_SITE_OTHER): Payer: BC Managed Care – PPO | Admitting: Vascular Surgery

## 2020-07-21 DIAGNOSIS — Z95828 Presence of other vascular implants and grafts: Secondary | ICD-10-CM

## 2020-07-21 DIAGNOSIS — I70213 Atherosclerosis of native arteries of extremities with intermittent claudication, bilateral legs: Secondary | ICD-10-CM

## 2020-07-21 NOTE — Progress Notes (Signed)
   [facility] [place]  Patient name: Ronnie Larsen. MRN: [id number] DOB: Dec 10, 1955 Sex: male  REASON FOR VISIT: Follow-up aortobifemoral bypass on 06/24/2020  HPI: Ronnie Wik. is a 65 y.o. male here today for follow-up.  He had severely limiting claudication with multilevel disease.  He underwent aortobifemoral bypass on 06/24/2020.  On postop day 1 he was found to have a moderate hematoma which was not expanding in his right groin.  I recommended return to the operating room for evacuation of this and this was uneventful for him.  He was discharged home on 06/29/2020.  He has done well since discharge.  He has the usual amount of diminished stamina.  He has having return of appetite and has return of bowel function.  He does report some continued soreness in his lower abdomen and also reports numbness in his anterior and medial thighs.  I explained that the numbness is related to sensory nerve incision in his groin and that this will resolve over the course of several months.  He really has not been able to walk to the level of claudication.  Current Outpatient Medications  Medication Sig Dispense Refill  . alfuzosin (UROXATRAL) 10 MG 24 hr tablet Take 1 tablet (10 mg total) by mouth daily with breakfast. 30 tablet 11  . aspirin EC 81 MG tablet Take 81 mg by mouth daily. Swallow whole.    . cilostazol (PLETAL) 50 MG tablet Take 50 mg by mouth 2 (two) times daily.    Marland Kitchen omeprazole (PRILOSEC) 20 MG capsule Take 20 mg by mouth daily.    Marland Kitchen oxyCODONE-acetaminophen (PERCOCET/ROXICET) 5-325 MG tablet Take 1 tablet by mouth every 6 (six) hours as needed. 30 tablet 0  . rosuvastatin (CRESTOR) 20 MG tablet Take 20 mg by mouth daily.     No current facility-administered medications for this visit.     PHYSICAL EXAM: There were no vitals filed for this visit.  GENERAL: The patient is a well-nourished male, in no acute distress. The vital  signs are documented above. Abdominal incision is completely healed as are both groin incisions.  He does have some fullness in the medial aspect of his right thigh related to the old hematoma  MEDICAL ISSUES: Stable overall.  I explained that he is at the expected level of recovery.  He will continue to increase his activities and will avoid any heavy straining for a total of 3 months following surgery.  I will see him again in 2 months for continued follow-up.  Will obtain ankle arm indices at that time.  I explained that typically patients lose approximately 15 to 20 pounds following the surgery due to slow return of appetite and he reports that he has lost approximately 20 pounds.  I asked assured him that he will regain this.  We will see him again in 2 months   Larina Earthly, MD [facility] [phone]  Note: Portions of this report may have been transcribed using voice recognition software.  Every effort has been made to ensure accuracy; however, inadvertent computerized transcription errors may still be present.

## 2020-07-23 ENCOUNTER — Other Ambulatory Visit: Payer: Self-pay

## 2020-07-23 DIAGNOSIS — I70213 Atherosclerosis of native arteries of extremities with intermittent claudication, bilateral legs: Secondary | ICD-10-CM

## 2020-08-12 ENCOUNTER — Encounter (HOSPITAL_COMMUNITY): Payer: Self-pay

## 2020-09-22 ENCOUNTER — Ambulatory Visit (INDEPENDENT_AMBULATORY_CARE_PROVIDER_SITE_OTHER): Payer: BC Managed Care – PPO | Admitting: Vascular Surgery

## 2020-09-22 ENCOUNTER — Encounter: Payer: Self-pay | Admitting: Vascular Surgery

## 2020-09-22 ENCOUNTER — Other Ambulatory Visit: Payer: Self-pay

## 2020-09-22 ENCOUNTER — Ambulatory Visit (INDEPENDENT_AMBULATORY_CARE_PROVIDER_SITE_OTHER): Payer: BC Managed Care – PPO

## 2020-09-22 ENCOUNTER — Ambulatory Visit: Payer: BC Managed Care – PPO | Admitting: Vascular Surgery

## 2020-09-22 VITALS — BP 176/84 | HR 62 | Temp 99.4°F | Ht 66.0 in | Wt 157.0 lb

## 2020-09-22 DIAGNOSIS — I70213 Atherosclerosis of native arteries of extremities with intermittent claudication, bilateral legs: Secondary | ICD-10-CM

## 2020-09-22 DIAGNOSIS — Z95828 Presence of other vascular implants and grafts: Secondary | ICD-10-CM

## 2020-09-22 NOTE — Progress Notes (Signed)
[facility] [place]  Patient name: Ronnie Larsen. MRN: [id number] DOB: 1955/08/08 Sex: male  REASON FOR VISIT: Follow-up aortobifemoral bypass  HPI: Ronnie Larsen. is a 65 y.o. male here today for continued follow-up.  He had severe aortic occlusive disease.  Also had known bilateral superficial femoral artery flush occlusions with reconstitution of the popliteal arteries.  He underwent uneventful aortobifemoral bypass.  He did return to the operating room on day 1 for evacuation of hematoma in his right groin.  He is here today for continued follow-up.  Surgery was on 06/24/2020.  He reports that he continues to have discomfort over his abdomen and abdominal thighs.  He reports this is a burning sensation with even close touching his abdomen and anterior thighs causing discomfort and burning sensation.  He does not have any ongoing claudication type symptoms.  He reports that his feet stay cold to the touch.  Current Outpatient Medications  Medication Sig Dispense Refill  . alfuzosin (UROXATRAL) 10 MG 24 hr tablet Take 1 tablet (10 mg total) by mouth daily with breakfast. 30 tablet 11  . aspirin EC 81 MG tablet Take 81 mg by mouth daily. Swallow whole.    . cilostazol (PLETAL) 50 MG tablet Take 50 mg by mouth 2 (two) times daily.    Marland Kitchen omeprazole (PRILOSEC) 20 MG capsule Take 20 mg by mouth daily.    Marland Kitchen oxyCODONE-acetaminophen (PERCOCET/ROXICET) 5-325 MG tablet Take 1 tablet by mouth every 6 (six) hours as needed. 30 tablet 0  . rosuvastatin (CRESTOR) 20 MG tablet Take 20 mg by mouth daily.     No current facility-administered medications for this visit.     PHYSICAL EXAM: Vitals:   09/22/20 0927  BP: (!) 176/84  Pulse: 62  Temp: 99.4 F (37.4 C)  SpO2: 99%  Weight: 157 lb (71.2 kg)  Height: 5\' 6"  (1.676 m)    GENERAL: The patient is a well-nourished male, in no acute distress. The vital signs are documented  above. Abdomen and femoral incisions completely healed.  2+ femoral pulses bilaterally.  No evidence of abdominal hernia No palpable pedal pulses  Data: Ankle arm index 0.84 on the right with biphasic signal in 0.80 on the left with biphasic signal.  MEDICAL ISSUES: Had long discussion with the patient.  Explained that the sensation from his skin incisions is typically resolved at 3 months which is where he is postop.  Explained that there is no specific treatment for this and it should continue to resolve with time.  He reports that he is very limited by this.  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  He tells me that his company is notify him he will be terminated from his position tomorrow since he cannot return to work.  He does not have any ischemia as cause for his discomfort.  I did discuss potential use of Neurontin although suspect that this would have low amount of improvement in his current symptoms.  He will continue his usual activities and will continue to work with his employer.  Is concerned that he would lose his insurance as well.  We will see him again in 6 months with repeat ankle arm index and follow-up   Systems analyst, MD FACS [facility] [phone]  Note: Portions of this report may have been transcribed  using voice recognition software.  Every effort has been made to ensure accuracy; however, inadvertent computerized transcription errors may still be present.

## 2020-09-23 ENCOUNTER — Other Ambulatory Visit: Payer: Self-pay

## 2020-09-23 DIAGNOSIS — I70213 Atherosclerosis of native arteries of extremities with intermittent claudication, bilateral legs: Secondary | ICD-10-CM

## 2021-05-23 LAB — COLOGUARD: COLOGUARD: NEGATIVE

## 2022-11-02 IMAGING — DX DG ABD PORTABLE 1V
1 series · 1 of 1 positions shown · non-contrast
Comparison: None.

CLINICAL DATA: Postop aortic by femoral bypass graft.

EXAM:
PORTABLE ABDOMEN - 1 VIEW

[abdomen supine]
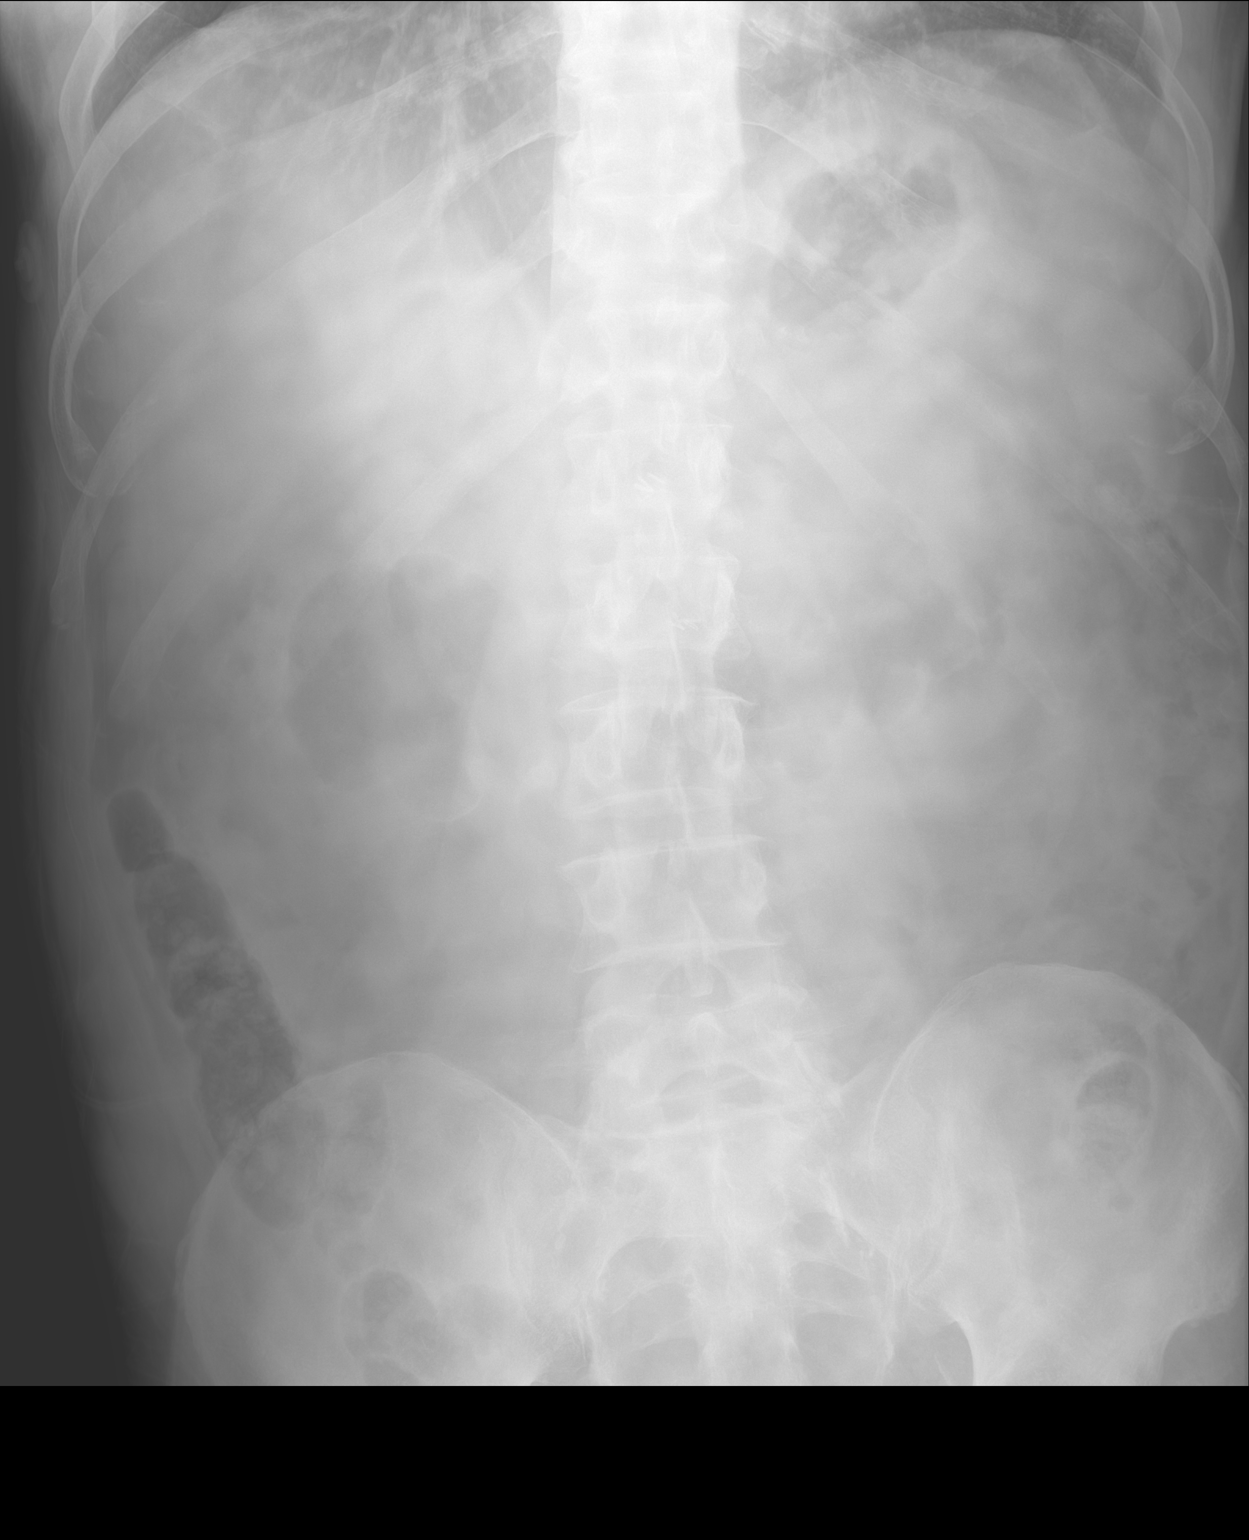

[1 of 1 positions shown; findings below may reference images not displayed]

FINDINGS: Surgical clips overlying L1 and L2 likely related to a aortic
surgery. No radiopaque graft identified.

Normal bowel gas pattern.
IMPRESSION: No acute abnormality.

## 2022-11-03 IMAGING — DX DG CHEST 1V PORT
1 series · 1 of 1 positions shown · non-contrast
Comparison: 06/24/2020

CLINICAL DATA: 64-year-old male status post aortobifemoral bypass
surgery.

EXAM:
PORTABLE CHEST 1 VIEW

[chest ap]
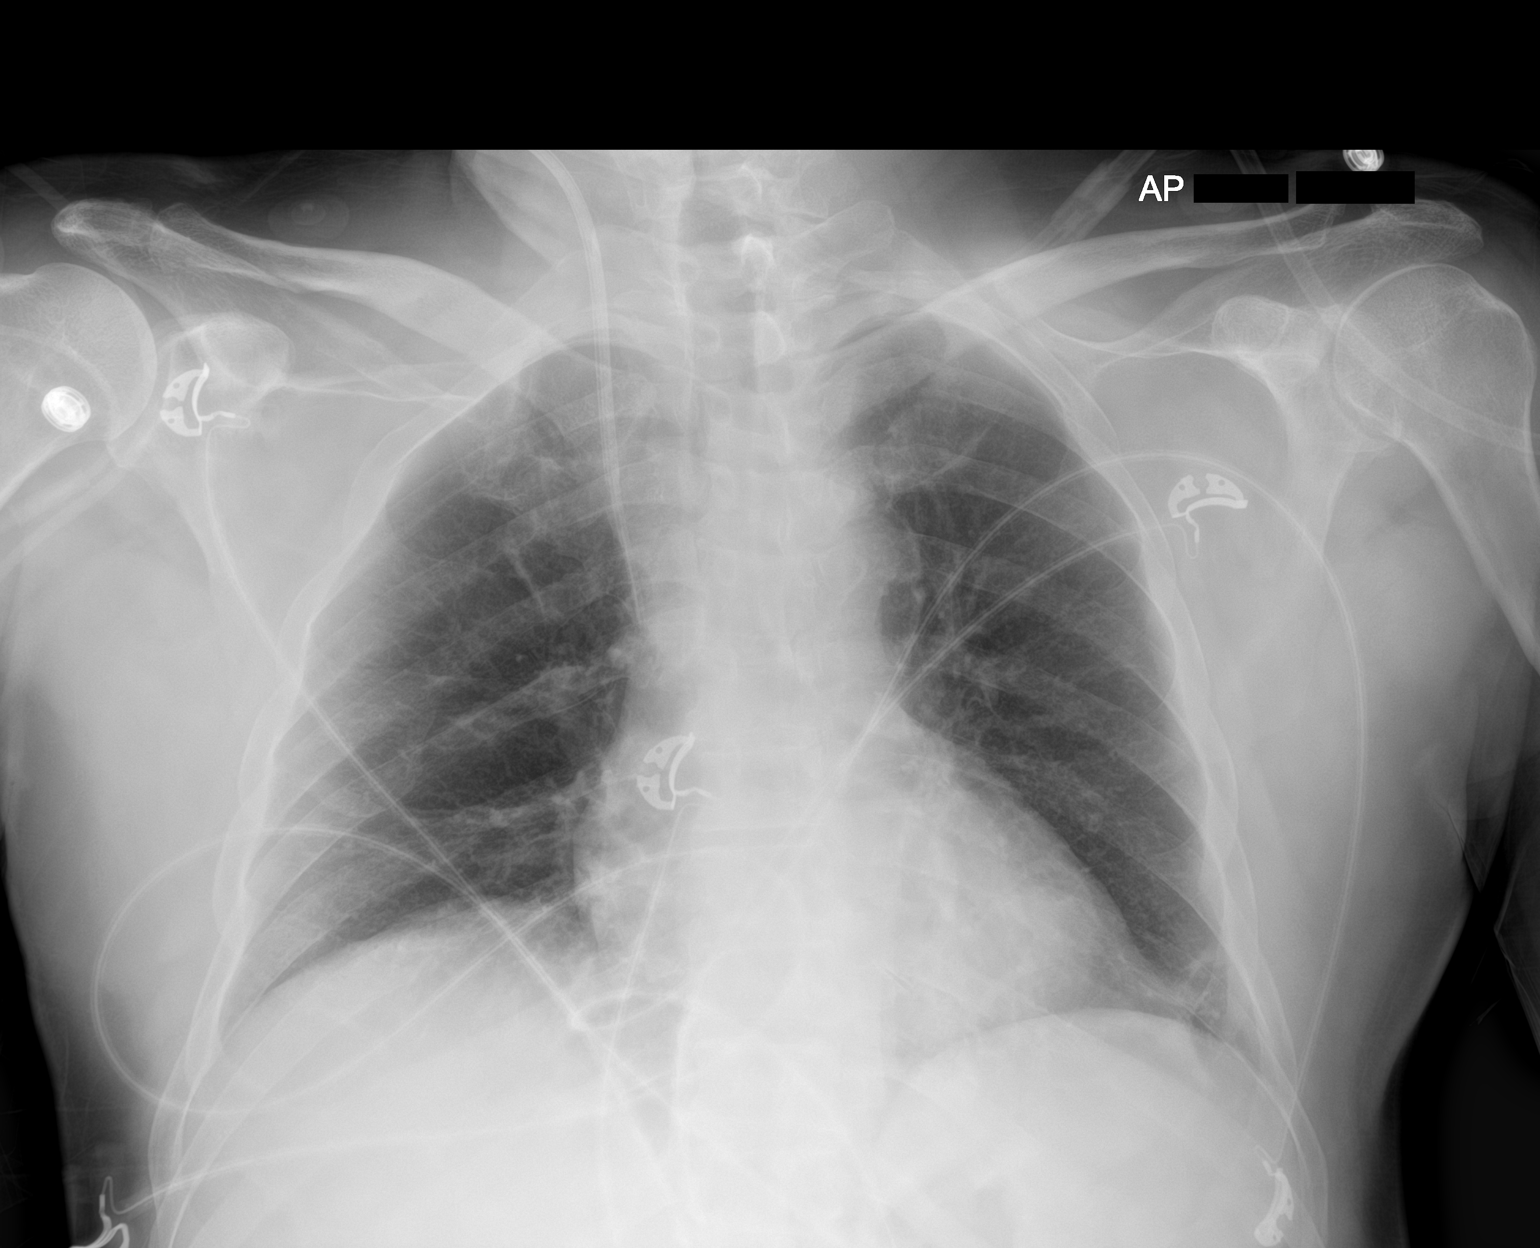

[1 of 1 positions shown; findings below may reference images not displayed]

FINDINGS: The heart size and mediastinal contours are within normal limits.
Unchanged position of right internal jugular central venous catheter
with the tip in the superior aspect of the superior vena cava.
Minimal bibasilar subsegmental atelectasis. No significant pleural
effusion or evidence of pneumothorax. The visualized skeletal
structures are unremarkable.
IMPRESSION: Minimal bibasilar subsegmental atelectasis.

## 2023-01-06 DIAGNOSIS — Z0100 Encounter for examination of eyes and vision without abnormal findings: Secondary | ICD-10-CM | POA: Diagnosis not present

## 2023-01-19 DIAGNOSIS — K7030 Alcoholic cirrhosis of liver without ascites: Secondary | ICD-10-CM | POA: Diagnosis not present

## 2023-01-19 DIAGNOSIS — E7849 Other hyperlipidemia: Secondary | ICD-10-CM | POA: Diagnosis not present

## 2023-01-19 DIAGNOSIS — F17210 Nicotine dependence, cigarettes, uncomplicated: Secondary | ICD-10-CM | POA: Diagnosis not present

## 2023-01-19 DIAGNOSIS — Z1329 Encounter for screening for other suspected endocrine disorder: Secondary | ICD-10-CM | POA: Diagnosis not present

## 2023-01-19 DIAGNOSIS — M109 Gout, unspecified: Secondary | ICD-10-CM | POA: Diagnosis not present

## 2023-01-19 DIAGNOSIS — I10 Essential (primary) hypertension: Secondary | ICD-10-CM | POA: Diagnosis not present

## 2023-01-19 DIAGNOSIS — R739 Hyperglycemia, unspecified: Secondary | ICD-10-CM | POA: Diagnosis not present

## 2023-01-19 DIAGNOSIS — E782 Mixed hyperlipidemia: Secondary | ICD-10-CM | POA: Diagnosis not present

## 2023-01-24 DIAGNOSIS — I739 Peripheral vascular disease, unspecified: Secondary | ICD-10-CM | POA: Diagnosis not present

## 2023-01-24 DIAGNOSIS — Z6824 Body mass index (BMI) 24.0-24.9, adult: Secondary | ICD-10-CM | POA: Diagnosis not present

## 2023-01-24 DIAGNOSIS — K2100 Gastro-esophageal reflux disease with esophagitis, without bleeding: Secondary | ICD-10-CM | POA: Diagnosis not present

## 2023-01-24 DIAGNOSIS — F17210 Nicotine dependence, cigarettes, uncomplicated: Secondary | ICD-10-CM | POA: Diagnosis not present

## 2023-01-24 DIAGNOSIS — F5221 Male erectile disorder: Secondary | ICD-10-CM | POA: Diagnosis not present

## 2023-01-24 DIAGNOSIS — Z23 Encounter for immunization: Secondary | ICD-10-CM | POA: Diagnosis not present

## 2023-01-24 DIAGNOSIS — E7849 Other hyperlipidemia: Secondary | ICD-10-CM | POA: Diagnosis not present

## 2023-01-24 DIAGNOSIS — I700 Atherosclerosis of aorta: Secondary | ICD-10-CM | POA: Diagnosis not present

## 2023-01-24 DIAGNOSIS — I10 Essential (primary) hypertension: Secondary | ICD-10-CM | POA: Diagnosis not present

## 2023-01-24 DIAGNOSIS — K7030 Alcoholic cirrhosis of liver without ascites: Secondary | ICD-10-CM | POA: Diagnosis not present

## 2023-06-10 DIAGNOSIS — M10071 Idiopathic gout, right ankle and foot: Secondary | ICD-10-CM | POA: Diagnosis not present

## 2023-10-17 DIAGNOSIS — F17210 Nicotine dependence, cigarettes, uncomplicated: Secondary | ICD-10-CM | POA: Diagnosis not present

## 2023-10-17 DIAGNOSIS — M10071 Idiopathic gout, right ankle and foot: Secondary | ICD-10-CM | POA: Diagnosis not present

## 2023-10-17 DIAGNOSIS — Z6824 Body mass index (BMI) 24.0-24.9, adult: Secondary | ICD-10-CM | POA: Diagnosis not present

## 2023-10-17 DIAGNOSIS — E782 Mixed hyperlipidemia: Secondary | ICD-10-CM | POA: Diagnosis not present

## 2023-10-17 DIAGNOSIS — I10 Essential (primary) hypertension: Secondary | ICD-10-CM | POA: Diagnosis not present

## 2024-02-16 DIAGNOSIS — Z1211 Encounter for screening for malignant neoplasm of colon: Secondary | ICD-10-CM | POA: Diagnosis not present

## 2024-02-16 DIAGNOSIS — Z1212 Encounter for screening for malignant neoplasm of rectum: Secondary | ICD-10-CM | POA: Diagnosis not present

## 2024-03-04 LAB — COLOGUARD: COLOGUARD: NEGATIVE
